# Patient Record
Sex: Female | Born: 1960 | Race: White | Hispanic: No | Marital: Married | Smoking: Never smoker
Health system: Southern US, Community
[De-identification: ages and names within clinical notes are randomized; demographics above are authoritative.]

## PROBLEM LIST (undated history)

## (undated) DIAGNOSIS — M1990 Unspecified osteoarthritis, unspecified site: Secondary | ICD-10-CM

## (undated) DIAGNOSIS — R519 Headache, unspecified: Secondary | ICD-10-CM

## (undated) DIAGNOSIS — R51 Headache: Secondary | ICD-10-CM

## (undated) DIAGNOSIS — G540 Brachial plexus disorders: Secondary | ICD-10-CM

## (undated) HISTORY — PX: DILATION AND CURETTAGE OF UTERUS: SHX78

## (undated) HISTORY — PX: OTHER SURGICAL HISTORY: SHX169

---

## 1999-03-24 ENCOUNTER — Other Ambulatory Visit: Admission: RE | Admit: 1999-03-24 | Discharge: 1999-03-24 | Payer: Self-pay | Admitting: *Deleted

## 2000-03-26 ENCOUNTER — Other Ambulatory Visit: Admission: RE | Admit: 2000-03-26 | Discharge: 2000-03-26 | Payer: Self-pay | Admitting: *Deleted

## 2001-05-19 ENCOUNTER — Other Ambulatory Visit: Admission: RE | Admit: 2001-05-19 | Discharge: 2001-05-19 | Payer: Self-pay | Admitting: Obstetrics and Gynecology

## 2004-08-01 ENCOUNTER — Ambulatory Visit (HOSPITAL_COMMUNITY): Admission: RE | Admit: 2004-08-01 | Discharge: 2004-08-01 | Payer: Self-pay | Admitting: Obstetrics and Gynecology

## 2004-08-01 ENCOUNTER — Encounter (INDEPENDENT_AMBULATORY_CARE_PROVIDER_SITE_OTHER): Payer: Self-pay | Admitting: *Deleted

## 2006-05-18 ENCOUNTER — Emergency Department (HOSPITAL_COMMUNITY): Admission: EM | Admit: 2006-05-18 | Discharge: 2006-05-18 | Payer: Self-pay | Admitting: Emergency Medicine

## 2007-10-04 ENCOUNTER — Encounter: Admission: RE | Admit: 2007-10-04 | Discharge: 2007-10-04 | Payer: Self-pay | Admitting: Obstetrics and Gynecology

## 2007-10-18 ENCOUNTER — Encounter: Admission: RE | Admit: 2007-10-18 | Discharge: 2007-10-18 | Payer: Self-pay | Admitting: Obstetrics and Gynecology

## 2008-11-20 ENCOUNTER — Encounter: Admission: RE | Admit: 2008-11-20 | Discharge: 2008-11-20 | Payer: Self-pay | Admitting: Obstetrics and Gynecology

## 2010-05-11 ENCOUNTER — Encounter: Payer: Self-pay | Admitting: Obstetrics and Gynecology

## 2010-09-05 NOTE — Op Note (Signed)
Allison Stevenson, Allison Stevenson              ACCOUNT NO.:  [id number]   MEDICAL RECORD NO.:  [id number]          PATIENT TYPE:  AMB   LOCATION:  SDC                           FACILITY:  [facility]   PHYSICIAN:  Richardean Sale, M.D.   DATE OF BIRTH:  1961/04/05   DATE OF PROCEDURE:  08/01/2004  DATE OF DISCHARGE:                                 OPERATIVE REPORT   PREOPERATIVE DIAGNOSIS:  Missed abortion.   POSTOPERATIVE DIAGNOSIS:  Missed abortion.   PROCEDURE:  Dilation and evacuation of uterus.   SURGEON:  Richardean Sale, M.D.   ANESTHESIA:  Conscious sedation, with paracervical block.   ESTIMATED BLOOD LOSS:  Minimal.   FINDINGS:  Products of conception.   SPECIMENS:  Products of conception.   COMPLICATIONS:  None.   ESTIMATED BLOOD LOSS:  Minimal.   INDICATIONS:  This is a 50 year old gravida 3 para 2-0-1-2 white female who  was originally seen at approximately [redacted] weeks gestation with vaginal  bleeding.  She underwent an ultrasound one week ago that showed an  intrauterine pregnancy at [redacted] weeks gestation with a heart rate of less than  120.  The patient returned to the office one week later, had a follow-up  ultrasound, which showed minimal growth of the fetal pole, and there was no  cardiac activity identified, consistent with missed AB.  She presents today  for surgical management with D&E.  Prior to the procedure, the risks had  been reviewed with the patient in detail.  We discussed the risks of  hemorrhage, infection, injury to the uterus that could require additional  surgery to the abdomen or cause infertility in the future, possibility of  anesthetic-related complications.  The patient expressed understanding of  all the risks and desires to proceed.  Informed consent obtained before  proceeding to the O.R.   PROCEDURE:  The patient was taken to the operating room, where she was given  conscious sedation.  She was then placed in the dorsal lithotomy position  and was prepped  and draped in the usual sterile fashion with Betadine.  A  red rubber catheter was then used to drain the bladder.  A bimanual exam was  performed which revealed a uterus that was approximately 6 weeks size,  midline, mobile, no masses.  Adnexa without masses.  At this point a  speculum was placed in the vagina, and 2 mL of 1% Nesacaine were injected at  the 12 o'clock position.  The cervix was then grasped with a single-tooth  tenaculum at the 12 o'clock position, and a paracervical block was  administered then using a total of 20 cc of 1% Nesacaine.  The cervix was  then very gently dilated with the [facility] dilators to a #23.  The #7 suction  curette was then introduced, and suction was performed.  Products of  conception were removed.  This was followed by a sharp curettage, where  there a moderate amount of products of conception that remained in the  uterus on the posterior wall.  Therefore, the suction was reintroduced, and  the additional products of conception were removed, and  sharp curettage was  performed one last time until a gritty texture was noted in all four  quadrants.  At this point, the procedure was terminated.  Specimen was sent  to pathology.  The single-tooth tenaculum was removed from the cervix.  There was minimal bleeding coming from the cervical os, and no bleeding from  the tenaculum site.  Speculum was removed.  Bimanual exam was performed.  The uterus was small, midline, mobile, with no masses.  At this point, the  patient was taken out of the dorsal lithotomy position.  Her conscious  sedation was reversed, and she was taken to the recovery room, awake, in  stable condition.  There were no complications.  All sponge, lap, needle,  and instrument counts were correct x 2.      JW/MEDQ  D:  08/01/2004  T:  08/01/2004  Job:  [id number]

## 2010-10-07 ENCOUNTER — Other Ambulatory Visit: Payer: Self-pay | Admitting: Family Medicine

## 2010-10-07 DIAGNOSIS — Z1231 Encounter for screening mammogram for malignant neoplasm of breast: Secondary | ICD-10-CM

## 2010-10-21 ENCOUNTER — Ambulatory Visit
Admission: RE | Admit: 2010-10-21 | Discharge: 2010-10-21 | Disposition: A | Discharge status: Home / Self Care | Payer: BC Managed Care – PPO | Source: Ambulatory Visit | Attending: Family Medicine | Admitting: Family Medicine

## 2010-10-21 DIAGNOSIS — Z1231 Encounter for screening mammogram for malignant neoplasm of breast: Secondary | ICD-10-CM

## 2014-04-10 ENCOUNTER — Other Ambulatory Visit: Payer: Self-pay | Admitting: Physician Assistant

## 2014-04-10 ENCOUNTER — Other Ambulatory Visit (HOSPITAL_COMMUNITY)
Admission: RE | Admit: 2014-04-10 | Discharge: 2014-04-10 | Disposition: A | Discharge status: Home / Self Care | Payer: BC Managed Care – PPO | Source: Ambulatory Visit | Attending: Family Medicine | Admitting: Family Medicine

## 2014-04-10 DIAGNOSIS — Z124 Encounter for screening for malignant neoplasm of cervix: Secondary | ICD-10-CM | POA: Insufficient documentation

## 2014-04-11 LAB — CYTOLOGY - PAP

## 2015-11-08 DIAGNOSIS — Z538 Procedure and treatment not carried out for other reasons: Secondary | ICD-10-CM | POA: Diagnosis not present

## 2015-11-08 DIAGNOSIS — Z1211 Encounter for screening for malignant neoplasm of colon: Secondary | ICD-10-CM | POA: Diagnosis not present

## 2017-03-15 DIAGNOSIS — R3915 Urgency of urination: Secondary | ICD-10-CM | POA: Diagnosis not present

## 2017-03-15 DIAGNOSIS — N3000 Acute cystitis without hematuria: Secondary | ICD-10-CM | POA: Diagnosis not present

## 2017-06-04 DIAGNOSIS — R6889 Other general symptoms and signs: Secondary | ICD-10-CM | POA: Diagnosis not present

## 2017-06-04 DIAGNOSIS — R350 Frequency of micturition: Secondary | ICD-10-CM | POA: Diagnosis not present

## 2017-06-04 DIAGNOSIS — N3000 Acute cystitis without hematuria: Secondary | ICD-10-CM | POA: Diagnosis not present

## 2017-06-06 DIAGNOSIS — G8929 Other chronic pain: Secondary | ICD-10-CM | POA: Diagnosis not present

## 2017-06-06 DIAGNOSIS — Z792 Long term (current) use of antibiotics: Secondary | ICD-10-CM | POA: Diagnosis not present

## 2017-06-06 DIAGNOSIS — M5441 Lumbago with sciatica, right side: Secondary | ICD-10-CM | POA: Diagnosis not present

## 2017-06-25 ENCOUNTER — Other Ambulatory Visit (HOSPITAL_COMMUNITY)
Admission: RE | Admit: 2017-06-25 | Discharge: 2017-06-25 | Disposition: A | Discharge status: Home / Self Care | Payer: BLUE CROSS/BLUE SHIELD | Source: Ambulatory Visit | Attending: Physician Assistant | Admitting: Physician Assistant

## 2017-06-25 ENCOUNTER — Other Ambulatory Visit: Payer: Self-pay | Admitting: Physician Assistant

## 2017-06-25 DIAGNOSIS — Z124 Encounter for screening for malignant neoplasm of cervix: Secondary | ICD-10-CM | POA: Diagnosis not present

## 2017-06-25 DIAGNOSIS — Z0000 Encounter for general adult medical examination without abnormal findings: Secondary | ICD-10-CM | POA: Diagnosis not present

## 2017-06-25 DIAGNOSIS — N952 Postmenopausal atrophic vaginitis: Secondary | ICD-10-CM | POA: Diagnosis not present

## 2017-06-25 DIAGNOSIS — G5601 Carpal tunnel syndrome, right upper limb: Secondary | ICD-10-CM | POA: Insufficient documentation

## 2017-06-25 DIAGNOSIS — R5383 Other fatigue: Secondary | ICD-10-CM | POA: Diagnosis not present

## 2017-06-25 DIAGNOSIS — M654 Radial styloid tenosynovitis [de Quervain]: Secondary | ICD-10-CM | POA: Insufficient documentation

## 2017-06-25 DIAGNOSIS — Z13220 Encounter for screening for lipoid disorders: Secondary | ICD-10-CM | POA: Diagnosis not present

## 2017-06-25 DIAGNOSIS — Z23 Encounter for immunization: Secondary | ICD-10-CM | POA: Diagnosis not present

## 2017-07-05 LAB — CYTOLOGY - PAP

## 2017-09-06 DIAGNOSIS — M51369 Other intervertebral disc degeneration, lumbar region without mention of lumbar back pain or lower extremity pain: Secondary | ICD-10-CM | POA: Insufficient documentation

## 2017-11-01 ENCOUNTER — Telehealth: Payer: Self-pay | Admitting: Vascular Surgery

## 2017-11-01 ENCOUNTER — Other Ambulatory Visit: Payer: Self-pay | Admitting: *Deleted

## 2017-11-01 NOTE — Telephone Encounter (Signed)
sch appt spk to pt 11/16/17 3pm f/u MD

## 2017-11-16 ENCOUNTER — Ambulatory Visit: Payer: BLUE CROSS/BLUE SHIELD | Admitting: Vascular Surgery

## 2017-11-22 ENCOUNTER — Other Ambulatory Visit: Payer: Self-pay

## 2017-11-22 ENCOUNTER — Encounter (HOSPITAL_COMMUNITY): Payer: Self-pay | Admitting: Urology

## 2017-11-22 ENCOUNTER — Encounter (HOSPITAL_COMMUNITY)
Admission: RE | Admit: 2017-11-22 | Discharge: 2017-11-22 | Disposition: A | Discharge status: Home / Self Care | Payer: Worker's Compensation | Source: Ambulatory Visit | Attending: Orthopedic Surgery | Admitting: Orthopedic Surgery

## 2017-11-22 DIAGNOSIS — M5136 Other intervertebral disc degeneration, lumbar region: Secondary | ICD-10-CM | POA: Diagnosis not present

## 2017-11-22 DIAGNOSIS — Z01818 Encounter for other preprocedural examination: Secondary | ICD-10-CM | POA: Diagnosis not present

## 2017-11-22 DIAGNOSIS — M4316 Spondylolisthesis, lumbar region: Secondary | ICD-10-CM | POA: Insufficient documentation

## 2017-11-22 DIAGNOSIS — M4186 Other forms of scoliosis, lumbar region: Secondary | ICD-10-CM | POA: Insufficient documentation

## 2017-11-22 HISTORY — DX: Headache, unspecified: R51.9

## 2017-11-22 HISTORY — DX: Brachial plexus disorders: G54.0

## 2017-11-22 HISTORY — DX: Headache: R51

## 2017-11-22 HISTORY — DX: Unspecified osteoarthritis, unspecified site: M19.90

## 2017-11-22 LAB — SURGICAL PCR SCREEN
MRSA, PCR: NEGATIVE
STAPHYLOCOCCUS AUREUS: POSITIVE — AB

## 2017-11-22 LAB — CBC
HEMATOCRIT: 38.3 % (ref 36.0–46.0)
HEMOGLOBIN: 12.5 g/dL (ref 12.0–15.0)
MCH: 30.9 pg (ref 26.0–34.0)
MCHC: 32.6 g/dL (ref 30.0–36.0)
MCV: 94.8 fL (ref 78.0–100.0)
Platelets: 215 10*3/uL (ref 150–400)
RBC: 4.04 MIL/uL (ref 3.87–5.11)
RDW: 12.2 % (ref 11.5–15.5)
WBC: 4.7 10*3/uL (ref 4.0–10.5)

## 2017-11-22 LAB — BASIC METABOLIC PANEL
ANION GAP: 8 (ref 5–15)
BUN: 12 mg/dL (ref 6–20)
CO2: 27 mmol/L (ref 22–32)
Calcium: 9.6 mg/dL (ref 8.9–10.3)
Chloride: 105 mmol/L (ref 98–111)
Creatinine, Ser: 0.64 mg/dL (ref 0.44–1.00)
Glucose, Bld: 94 mg/dL (ref 70–99)
POTASSIUM: 3.8 mmol/L (ref 3.5–5.1)
SODIUM: 140 mmol/L (ref 135–145)

## 2017-11-22 LAB — ABO/RH: ABO/RH(D): A POS

## 2017-11-22 LAB — TYPE AND SCREEN
ABO/RH(D): A POS
Antibody Screen: NEGATIVE

## 2017-11-22 NOTE — Pre-Procedure Instructions (Signed)
Allison Stevenson  11/22/2017      [facility] - [address] ([address]), [place] - [address] [place] (SE) [place] [address] Phone: [phone] Fax: [phone]    Your procedure is scheduled on Wednesday, August 14.             Report to [facility] Admitting at 6:30 AM                 Your surgery or procedure is scheduled for 8:30 A.M.   Call this number if you have problems the morning of surgery: [phone]  This is the number for the Pre- Surgical Desk.     For any other questions, please call [phone], Monday - Friday 8 AM - 4 PM.   Remember:  Do not eat or drink after midnight Tuesday, August 13.    Take these medicines the morning of surgery with A SIP OF WATER :   Take if needed: acetaminophen (TYLENOL), loratadine (CLARITIN).  1 Week prior to surgery STOP taking Aspirin, Aspirin Products (Goody Powder, Excedrin Migraine), Ibuprofen (Advil), Naproxen (Aleve), Vitamins and Herbal Products (ie Fish Oil).   Do not wear jewelry, make-up or nail polish.  Do not wear lotions, powders, or perfumes, or deodorant.  Do not shave 48 hours prior to surgery.  Men may shave face and neck.  Do not bring valuables to the hospital.  [facility] is not responsible for any belongings or valuables.  Contacts, dentures or bridgework may not be worn into surgery.  Leave your suitcase in the car.  After surgery it may be brought to your room.  For patients admitted to the hospital, discharge time will be determined by your treatment team.  Patients discharged the day of surgery will not be allowed to drive home.   Special instructions:   - Preparing For Surgery  Before surgery, you can play an important role. Because skin is not sterile, your skin needs to be as free of germs as possible. You can reduce the number of germs on your skin by washing with CHG (chlorahexidine gluconate) Soap before surgery.  CHG is an antiseptic cleaner  which kills germs and bonds with the skin to continue killing germs even after washing.    Oral Hygiene is also important to reduce your risk of infection.  Remember - BRUSH YOUR TEETH THE MORNING OF SURGERY WITH YOUR REGULAR TOOTHPASTE  Please do not use if you have an allergy to CHG or antibacterial soaps. If your skin becomes reddened/irritated stop using the CHG.  Do not shave (including legs and underarms) for at least 48 hours prior to first CHG shower. It is OK to shave your face.  Please follow these instructions carefully.   1. Shower the NIGHT BEFORE SURGERY and the MORNING OF SURGERY with CHG.   2. If you chose to wash your hair, wash your hair first as usual with your normal shampoo.  3. After you shampoo, rinse your hair and body thoroughly to remove the shampoo.  4. Use CHG as you would any other liquid soap. You can apply CHG directly to the skin and wash gently with a scrungie or a clean washcloth.   5. Apply the CHG Soap to your body ONLY FROM THE NECK DOWN.  Do not use on open wounds or open sores. Avoid contact with your eyes, ears, mouth and genitals (private parts). Wash Face and genitals (private parts)  with your normal soap.  6. Wash thoroughly, paying special attention to the area where your surgery will be performed.  7. Thoroughly rinse your body with warm water from the neck down.  8. DO NOT shower/wash with your normal soap after using and rinsing off the CHG Soap.  9. Pat yourself dry with a CLEAN TOWEL.  10. Wear CLEAN PAJAMAS to bed the night before surgery, wear comfortable clothes the morning of surgery  11. Place CLEAN SHEETS on your bed the night of your first shower and DO NOT SLEEP WITH PETS.   Day of Surgery:  Do not apply any deodorants/lotions.  Please wear clean clothes to the hospital/surgery center.   Remember to brush your teeth WITH YOUR REGULAR TOOTHPASTE.  Please read over the following fact sheets that you were given.
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
## 2017-11-22 NOTE — Progress Notes (Signed)
Nasal PCR negative for MRSA, positive for MSSA. Prescription called to pharmacy and patient notified.  

## 2017-11-22 NOTE — Progress Notes (Signed)
Pt sees Dr. Dierdre SearlesLi for Thoracic Outlet syndrome- pt unable to lift arms over head for long periods of time d/t numbness and tingling. Pt states this numbness will eventually go away, but she is worried about positioning during surgery. Pt states she will notify Dr. Arbie CookeyEarly of this as well as Dr. Shon BatonBrooks.   Denies cardiac history   Anesthesia review: order per Dr. Shon BatonBrooks for anesthesia consult, Fayrene FearingJames with anesthesia saw pt at her PAT appointment today  Patient denies shortness of breath, fever, cough and chest pain at PAT appointment   Patient verbalized understanding of instructions that were given to them at the PAT appointment. Patient was also instructed that they will need to review over the PAT instructions again at home before surgery.

## 2017-11-22 NOTE — Pre-Procedure Instructions (Signed)
Allison Stevenson  11/22/2017      [facility] [address] - [address] [place]) [place] [address] Phone: [phone] Fax: [phone]    Your procedure is scheduled on Wednesday, August 14.             [facility] at 6:30 AM                 Your surgery or procedure is scheduled for 8:30 A.M.   Call this number if you have problems the morning of surgery: [phone]  This is the number for the Pre- Surgical Desk.     For any other questions, please call [phone], Monday - Friday 8 AM - 4 PM.   Remember:  Do not eat or drink after midnight Tuesday, August 13.    Take these medicines the morning of surgery with A SIP OF WATER :  Take if needed: acetaminophen (TYLENOL), loratadine (CLARITIN). ,1 Week prior to surgery STOP taking Aspirin, Aspirin Products (Goody Powder, Excedrin Migraine), Ibuprofen (Advil), Naproxen (Aleve), Vitamins and Herbal Products (ie Fish Oil).   Do not wear jewelry, make-up or nail polish.  Do not wear lotions, powders, or perfumes, or deodorant.  Do not shave 48 hours prior to surgery.  Men may shave face and neck.  Do not bring valuables to the hospital.  [facility] is not responsible for any belongings or valuables.  Contacts, dentures or bridgework may not be worn into surgery.  Leave your suitcase in the car.  After surgery it may be brought to your room.  For patients admitted to the hospital, discharge time will be determined by your treatment team.  Patients discharged the day of surgery will not be allowed to drive home.   Special instructions:   [place]- Preparing For Surgery  Before surgery, you can play an important role. Because skin is not sterile, your skin needs to be as free of germs as possible. You can reduce the number of germs on your skin by washing with CHG (chlorahexidine gluconate) Soap before surgery.  CHG is an antiseptic cleaner which  kills germs and bonds with the skin to continue killing germs even after washing.    Oral Hygiene is also important to reduce your risk of infection.  Remember - BRUSH YOUR TEETH THE MORNING OF SURGERY WITH YOUR REGULAR TOOTHPASTE  Please do not use if you have an allergy to CHG or antibacterial soaps. If your skin becomes reddened/irritated stop using the CHG.  Do not shave (including legs and underarms) for at least 48 hours prior to first CHG shower. It is OK to shave your face.  Please follow these instructions carefully.   1. Shower the NIGHT BEFORE SURGERY and the MORNING OF SURGERY with CHG.   2. If you chose to wash your hair, wash your hair first as usual with your normal shampoo.  3. After you shampoo, rinse your hair and body thoroughly to remove the shampoo.  4. Use CHG as you would any other liquid soap. You can apply CHG directly to the skin and wash gently with a scrungie or a clean washcloth.   5. Apply the CHG Soap to your body ONLY FROM THE NECK DOWN.  Do not use on open wounds or open sores. Avoid contact with your eyes, ears, mouth and genitals (private parts). Wash Face and genitals (private parts)  with your normal soap.  6.  Wash thoroughly, paying special attention to the area where your surgery will be performed.  7. Thoroughly rinse your body with warm water from the neck down.  8. DO NOT shower/wash with your normal soap after using and rinsing off the CHG Soap.  9. Pat yourself dry with a CLEAN TOWEL.  10. Wear CLEAN PAJAMAS to bed the night before surgery, wear comfortable clothes the morning of surgery  11. Place CLEAN SHEETS on your bed the night of your first shower and DO NOT SLEEP WITH PETS.   Day of Surgery:  Do not apply any deodorants/lotions.  Please wear clean clothes to the hospital/surgery center.   Remember to brush your teeth WITH YOUR REGULAR TOOTHPASTE.  Please read over the following fact sheets that you were given.

## 2017-11-23 ENCOUNTER — Ambulatory Visit: Payer: BLUE CROSS/BLUE SHIELD | Admitting: Vascular Surgery

## 2017-11-24 ENCOUNTER — Encounter: Payer: Self-pay | Admitting: Vascular Surgery

## 2017-11-24 ENCOUNTER — Ambulatory Visit (INDEPENDENT_AMBULATORY_CARE_PROVIDER_SITE_OTHER): Payer: Worker's Compensation | Admitting: Vascular Surgery

## 2017-11-24 ENCOUNTER — Other Ambulatory Visit: Payer: Self-pay

## 2017-11-24 VITALS — BP 99/69 | HR 75 | Resp 18 | Ht 70.5 in | Wt 139.6 lb

## 2017-11-24 DIAGNOSIS — M5136 Other intervertebral disc degeneration, lumbar region: Secondary | ICD-10-CM
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
## 2017-11-26 NOTE — H&P (Addendum)
Patient ID: Salvadore DomMelody Stotts MRN: [id number] DOB/AGE: November 22, 1960 57 y.o.  Admit date: (Not on file)  Admission Diagnoses:  Degenerative lumbar Scoliosis  HPI: Pleasant 57 year old female patient presents to clinic for preop.  Patient is scheduled to have an anterior lumbar interbody fusion and extreme lumbar interbody fusion Wednesday, August 14.  That will be followed on Thursday, August 15 with a posterior  Interbody fusion L2-5. Pt has a hx of thoracic outlet syndrome which gives her b/l arm numbness and pain.  She has had episodes of arm paralysis.  Pt overall reports hx of good health. Past Medical History: Past Medical History:  Diagnosis Date  . Arthritis    thumbs  . Headache    history of migraines when on birth control and artificial sweeteners  . Thoracic outlet syndrome     Surgical History: Past Surgical History:  Procedure Laterality Date  . CESAREAN SECTION    . DILATION AND CURETTAGE OF UTERUS     after miscarriage  . vein stripped      Family History: No family history on file.  Social History: Social History   Socioeconomic History  . Marital status: Married    Spouse name: Not on file  . Number of children: Not on file  . Years of education: Not on file  . Highest education level: Not on file  Occupational History  . Not on file  Social Needs  . Financial resource strain: Not on file  . Food insecurity:    Worry: Not on file    Inability: Not on file  . Transportation needs:    Medical: Not on file    Non-medical: Not on file  Tobacco Use  . Smoking status: Never Smoker  . Smokeless tobacco: Never Used  Substance and Sexual Activity  . Alcohol use: Never    Frequency: Never  . Drug use: Never  . Sexual activity: Not on file  Lifestyle  . Physical activity:    Days per week: Not on file    Minutes per session: Not on file  . Stress: Not on file  Relationships  . Social connections:    Talks on phone: Not on file    Gets  together: Not on file    Attends religious service: Not on file    Active member of club or organization: Not on file    Attends meetings of clubs or organizations: Not on file    Relationship status: Not on file  . Intimate partner violence:    Fear of current or ex partner: Not on file    Emotionally abused: Not on file    Physically abused: Not on file    Forced sexual activity: Not on file  Other Topics Concern  . Not on file  Social History Narrative  . Not on file    Allergies: Bee venom  Medications: I have reviewed the patient's current medications.  Vital Signs: No data found.  Radiology: No results found.  Labs: No results for input(s): WBC, RBC, HCT, PLT in the last 72 hours. No results for input(s): NA, K, CL, CO2, BUN, CREATININE, GLUCOSE, CALCIUM in the last 72 hours. No results for input(s): LABPT, INR in the last 72 hours.  Review of Systems: ROS  Physical Exam: There is no height or weight on file to calculate BMI.  Physical Exam  Constitutional: She is oriented to person, place, and time. She appears well-developed and well-nourished.  HENT:  Head: Normocephalic.  Cardiovascular: Normal  rate and regular rhythm.  Respiratory: Effort normal and breath sounds normal.  GI: Soft. Bowel sounds are normal.  Neurological: She is alert and oriented to person, place, and time.  Skin: Skin is warm and dry.  Psychiatric: She has a normal mood and affect. Her behavior is normal. Judgment and thought content normal.    Continues to have severe debilitating low back pain radiating into the buttocks.  She notes intermittent dysesthesias in the right lower extremity.  No significant hip, knee, ankle pain with isolated joint range of motion. Neuro: 5 out of 5 strength in the lower extremity, negative straight leg raise test.  Negative femoral stretch test.  Positive decreased sensation light touch in the L2 and L3 dermatome right side.  Symmetrical 2+ deep tendon  reflexes at the knee and the Achilles. Reflexes: Babinski: Negative Hoffman: Negative Vascular: Lower extremity peripheral pulses are 2+ and symmetrical in the lower extremity.  Compartments are soft and nontender  Imaging studies: 2 view scoliosis x-rays demonstrate a slight upper thoracic scoliotic curve.  overall sagittal balance is well maintained.  Coronal balance she is slightly shifted approximately 3 cm due to the lateral listhesis.  Assessment and Plan: Risks and benefits of surgery were discussed with the patient. These include: Infection, bleeding, death, stroke, paralysis, ongoing or worse pain, need for additional surgery, nonunion, leak of spinal fluid, adjacent segment degeneration requiring additional fusion surgery, need for posterior decompression and/or fusion. Bleeding from major vessels, and blood clots (deep venous thrombosis)requiring additional treatment. Due to the abdominal contents requiring further intervention, loss in bowel and bladder control. Additional risk for female patients: Retrograde ejaculation, and therefore infertility.  Goal of surgery: Reduced (not eliminated) pain and therefore improved quality of life.  Anette Riedel, PAC for Venita Lick, MD [facility] [phone]  Patient presents today for two-stage surgical fusion of her lumbar spine.  Clinical exam is unchanged from her last office visit on 11/26/2017.  I reviewed the procedure with the patient and her husband.  Today's procedure will be an anterior lumbar interbody fusion at L4-5, followed by a lateral interbody fusion L2-3 and L3-4.  Tomorrow we will plan on posterior supplemental pedicle screw fixation L2-5.  This should allow for correction of the degenerative scoliosis and address the sagittal imbalance degenerative disc disease.  I have again gone over the risks of surgery which include infection, bleeding, death, stroke, paralysis, ongoing or worse pain, need for additional surgery,  nonunion, leak of spinal fluid, adjacent segment degeneration requiring additional surgery including extension of the fusion, injury or damage to the lumbar plexus which are resulting in hip flexor weakness and difficulty walking injury to the abdominal contents requiring further intervention, loss of bowel and bladder control, deep venous thrombosis (blood clots), bleeding from major vessels requiring transfusion or additional intervention.  The patient has expressed understanding of the risks as well as the desire to move forward with surgery.

## 2017-12-01 ENCOUNTER — Inpatient Hospital Stay (HOSPITAL_COMMUNITY)
Admission: RE | Admit: 2017-12-01 | Discharge: 2017-12-04 | DRG: 455 | Disposition: A | Discharge status: Home Health | Payer: Worker's Compensation | Source: Ambulatory Visit | Attending: Orthopedic Surgery | Admitting: Orthopedic Surgery

## 2017-12-01 ENCOUNTER — Inpatient Hospital Stay (HOSPITAL_COMMUNITY): Payer: Worker's Compensation

## 2017-12-01 ENCOUNTER — Encounter (HOSPITAL_COMMUNITY): Payer: Self-pay | Admitting: Certified Registered"

## 2017-12-01 ENCOUNTER — Other Ambulatory Visit: Payer: Self-pay

## 2017-12-01 ENCOUNTER — Inpatient Hospital Stay (HOSPITAL_COMMUNITY): Payer: Worker's Compensation | Admitting: Certified Registered"

## 2017-12-01 ENCOUNTER — Inpatient Hospital Stay (HOSPITAL_COMMUNITY): Payer: Worker's Compensation | Admitting: Physician Assistant

## 2017-12-01 ENCOUNTER — Inpatient Hospital Stay (HOSPITAL_COMMUNITY)
Admission: RE | Disposition: A | Discharge status: Home Health | Payer: Self-pay | Source: Ambulatory Visit | Attending: Orthopedic Surgery

## 2017-12-01 DIAGNOSIS — M19041 Primary osteoarthritis, right hand: Secondary | ICD-10-CM | POA: Diagnosis present

## 2017-12-01 DIAGNOSIS — M5417 Radiculopathy, lumbosacral region: Secondary | ICD-10-CM | POA: Diagnosis not present

## 2017-12-01 DIAGNOSIS — M19042 Primary osteoarthritis, left hand: Secondary | ICD-10-CM | POA: Diagnosis present

## 2017-12-01 DIAGNOSIS — M4156 Other secondary scoliosis, lumbar region: Secondary | ICD-10-CM | POA: Diagnosis present

## 2017-12-01 DIAGNOSIS — Z419 Encounter for procedure for purposes other than remedying health state, unspecified: Secondary | ICD-10-CM

## 2017-12-01 DIAGNOSIS — Z981 Arthrodesis status: Secondary | ICD-10-CM

## 2017-12-01 DIAGNOSIS — M5136 Other intervertebral disc degeneration, lumbar region: Secondary | ICD-10-CM | POA: Diagnosis not present

## 2017-12-01 DIAGNOSIS — Q763 Congenital scoliosis due to congenital bony malformation: Secondary | ICD-10-CM | POA: Diagnosis not present

## 2017-12-01 DIAGNOSIS — Z91030 Bee allergy status: Secondary | ICD-10-CM

## 2017-12-01 DIAGNOSIS — M4150 Other secondary scoliosis, site unspecified: Secondary | ICD-10-CM | POA: Diagnosis present

## 2017-12-01 HISTORY — PX: ANTERIOR LAT LUMBAR FUSION: SHX1168

## 2017-12-01 HISTORY — PX: ANTERIOR LUMBAR FUSION: SHX1170

## 2017-12-01 HISTORY — PX: ABDOMINAL EXPOSURE: SHX5708

## 2017-12-01 SURGERY — ANTERIOR LUMBAR FUSION 1 LEVEL
Anesthesia: General | Site: Back

## 2017-12-01 MED ORDER — ONDANSETRON HCL 4 MG/2ML IJ SOLN
INTRAMUSCULAR | Status: DC | PRN
  Administered 2017-12-01: 4 mg via INTRAVENOUS

## 2017-12-01 MED ORDER — 0.9 % SODIUM CHLORIDE (POUR BTL) OPTIME
TOPICAL | Status: DC | PRN
  Administered 2017-12-01 (×3): 1000 mL

## 2017-12-01 MED ORDER — DEXAMETHASONE SODIUM PHOSPHATE 10 MG/ML IJ SOLN
INTRAMUSCULAR | Status: DC | PRN
  Administered 2017-12-01: 10 mg via INTRAVENOUS

## 2017-12-01 MED ORDER — PROMETHAZINE HCL 25 MG/ML IJ SOLN: 6.2500 mg | INTRAMUSCULAR | Status: DC | PRN

## 2017-12-01 MED ORDER — PHENYLEPHRINE 40 MCG/ML (10ML) SYRINGE FOR IV PUSH (FOR BLOOD PRESSURE SUPPORT)
PREFILLED_SYRINGE | INTRAVENOUS | Status: DC | PRN
  Administered 2017-12-01: 80 ug via INTRAVENOUS
  Administered 2017-12-01: 40 ug via INTRAVENOUS
  Administered 2017-12-01 (×3): 80 ug via INTRAVENOUS

## 2017-12-01 MED ORDER — LIDOCAINE 2% (20 MG/ML) 5 ML SYRINGE
INTRAMUSCULAR | Status: DC | PRN
  Administered 2017-12-01: 80 mg via INTRAVENOUS

## 2017-12-01 MED ORDER — SODIUM CHLORIDE 0.9 % IV SOLN
INTRAVENOUS | Status: AC
  Filled 2017-12-01: qty 1.2

## 2017-12-01 MED ORDER — LIDOCAINE 2% (20 MG/ML) 5 ML SYRINGE
INTRAMUSCULAR | Status: AC
  Filled 2017-12-01: qty 5

## 2017-12-01 MED ORDER — HYDROMORPHONE HCL 1 MG/ML IJ SOLN
INTRAMUSCULAR | Status: AC
  Filled 2017-12-01: qty 1

## 2017-12-01 MED ORDER — OXYCODONE HCL 5 MG/5ML PO SOLN: 5.0000 mg | Freq: Once | ORAL | Status: DC | PRN

## 2017-12-01 MED ORDER — PROPOFOL 500 MG/50ML IV EMUL
INTRAVENOUS | Status: DC | PRN
  Administered 2017-12-01: 50 ug/kg/min via INTRAVENOUS

## 2017-12-01 MED ORDER — CEFAZOLIN SODIUM-DEXTROSE 2-4 GM/100ML-% IV SOLN
INTRAVENOUS | Status: AC
  Filled 2017-12-01: qty 100

## 2017-12-01 MED ORDER — MIDAZOLAM HCL 5 MG/5ML IJ SOLN
INTRAMUSCULAR | Status: DC | PRN
Start: 2017-12-01 — End: 2017-12-01
  Administered 2017-12-01: 1 mg via INTRAVENOUS
  Administered 2017-12-01: 2 mg via INTRAVENOUS
  Administered 2017-12-01: 1 mg via INTRAVENOUS

## 2017-12-01 MED ORDER — ROCURONIUM BROMIDE 10 MG/ML (PF) SYRINGE
PREFILLED_SYRINGE | INTRAVENOUS | Status: AC
  Filled 2017-12-01: qty 10

## 2017-12-01 MED ORDER — ACETAMINOPHEN 650 MG RE SUPP: 650.0000 mg | RECTAL | Status: DC | PRN

## 2017-12-01 MED ORDER — SUFENTANIL CITRATE 50 MCG/ML IV SOLN
INTRAVENOUS | Status: AC
Start: 2017-12-01 — End: ?
  Filled 2017-12-01: qty 1

## 2017-12-01 MED ORDER — METHOCARBAMOL 500 MG PO TABS
500.0000 mg | ORAL_TABLET | Freq: Four times a day (QID) | ORAL | Status: DC | PRN
  Administered 2017-12-01: 500 mg via ORAL
  Filled 2017-12-01: qty 1

## 2017-12-01 MED ORDER — SODIUM CHLORIDE 0.9 % IV SOLN
INTRAVENOUS | Status: DC | PRN
  Administered 2017-12-01: 50 ug/min via INTRAVENOUS

## 2017-12-01 MED ORDER — HYDROMORPHONE HCL 1 MG/ML IJ SOLN
0.2500 mg | INTRAMUSCULAR | Status: DC | PRN
  Administered 2017-12-01 (×3): 0.5 mg via INTRAVENOUS

## 2017-12-01 MED ORDER — PROPOFOL 10 MG/ML IV BOLUS
INTRAVENOUS | Status: DC | PRN
  Administered 2017-12-01: 10 mg via INTRAVENOUS
  Administered 2017-12-01: 20 mg via INTRAVENOUS
  Administered 2017-12-01: 10 mg via INTRAVENOUS
  Administered 2017-12-01 (×2): 20 mg via INTRAVENOUS
  Administered 2017-12-01: 130 mg via INTRAVENOUS

## 2017-12-01 MED ORDER — MIDAZOLAM HCL 2 MG/2ML IJ SOLN
INTRAMUSCULAR | Status: AC
  Filled 2017-12-01: qty 2

## 2017-12-01 MED ORDER — SUGAMMADEX SODIUM 200 MG/2ML IV SOLN
INTRAVENOUS | Status: DC | PRN
  Administered 2017-12-01: 130 mg via INTRAVENOUS

## 2017-12-01 MED ORDER — HEMOSTATIC AGENTS (NO CHARGE) OPTIME
TOPICAL | Status: DC | PRN
  Administered 2017-12-01 (×2): 1

## 2017-12-01 MED ORDER — OXYCODONE HCL 5 MG PO TABS: 5.0000 mg | ORAL_TABLET | Freq: Once | ORAL | Status: DC | PRN

## 2017-12-01 MED ORDER — ROCURONIUM BROMIDE 10 MG/ML (PF) SYRINGE
PREFILLED_SYRINGE | INTRAVENOUS | Status: DC | PRN
  Administered 2017-12-01: 10 mg via INTRAVENOUS
  Administered 2017-12-01: 60 mg via INTRAVENOUS
  Administered 2017-12-01: 20 mg via INTRAVENOUS
  Administered 2017-12-01: 10 mg via INTRAVENOUS

## 2017-12-01 MED ORDER — PHENYLEPHRINE 40 MCG/ML (10ML) SYRINGE FOR IV PUSH (FOR BLOOD PRESSURE SUPPORT)
PREFILLED_SYRINGE | INTRAVENOUS | Status: AC
  Filled 2017-12-01: qty 10

## 2017-12-01 MED ORDER — DOCUSATE SODIUM 100 MG PO CAPS
100.0000 mg | ORAL_CAPSULE | Freq: Two times a day (BID) | ORAL | Status: DC
  Administered 2017-12-01 – 2017-12-04 (×5): 100 mg via ORAL
  Filled 2017-12-01 (×6): qty 1

## 2017-12-01 MED ORDER — DEXAMETHASONE SODIUM PHOSPHATE 10 MG/ML IJ SOLN
INTRAMUSCULAR | Status: AC
  Filled 2017-12-01: qty 1

## 2017-12-01 MED ORDER — OXYCODONE HCL 5 MG PO TABS
10.0000 mg | ORAL_TABLET | ORAL | Status: DC | PRN
  Administered 2017-12-04 (×3): 10 mg via ORAL
  Filled 2017-12-01 (×3): qty 2

## 2017-12-01 MED ORDER — MAGNESIUM CITRATE PO SOLN: 1.0000 | Freq: Once | ORAL | Status: DC | PRN

## 2017-12-01 MED ORDER — BUPIVACAINE-EPINEPHRINE 0.25% -1:200000 IJ SOLN
INTRAMUSCULAR | Status: AC
  Filled 2017-12-01: qty 1

## 2017-12-01 MED ORDER — CEFAZOLIN SODIUM-DEXTROSE 2-4 GM/100ML-% IV SOLN
2.0000 g | Freq: Three times a day (TID) | INTRAVENOUS | Status: AC
  Administered 2017-12-01 – 2017-12-02 (×2): 2 g via INTRAVENOUS
  Filled 2017-12-01 (×2): qty 100

## 2017-12-01 MED ORDER — CALCIUM CHLORIDE 10 % IV SOLN
INTRAVENOUS | Status: AC
  Filled 2017-12-01: qty 10

## 2017-12-01 MED ORDER — CEFAZOLIN SODIUM-DEXTROSE 2-4 GM/100ML-% IV SOLN
2.0000 g | INTRAVENOUS | Status: AC
  Administered 2017-12-01 (×2): 2 g via INTRAVENOUS

## 2017-12-01 MED ORDER — MEPERIDINE HCL 50 MG/ML IJ SOLN: 6.2500 mg | INTRAMUSCULAR | Status: DC | PRN

## 2017-12-01 MED ORDER — OXYCODONE HCL 5 MG PO TABS
5.0000 mg | ORAL_TABLET | ORAL | Status: DC | PRN
  Administered 2017-12-01 – 2017-12-03 (×6): 5 mg via ORAL
  Filled 2017-12-01 (×6): qty 1

## 2017-12-01 MED ORDER — ONDANSETRON HCL 4 MG/2ML IJ SOLN
4.0000 mg | Freq: Four times a day (QID) | INTRAMUSCULAR | Status: DC | PRN
  Administered 2017-12-02: 4 mg via INTRAVENOUS
  Filled 2017-12-01: qty 2

## 2017-12-01 MED ORDER — ONDANSETRON HCL 4 MG/2ML IJ SOLN
INTRAMUSCULAR | Status: AC
  Filled 2017-12-01: qty 2

## 2017-12-01 MED ORDER — SODIUM CHLORIDE 0.9 % IV SOLN
250.0000 mL | INTRAVENOUS | Status: DC
  Administered 2017-12-02: 250 mL via INTRAVENOUS

## 2017-12-01 MED ORDER — SODIUM CHLORIDE 0.9% FLUSH: 3.0000 mL | INTRAVENOUS | Status: DC | PRN

## 2017-12-01 MED ORDER — MORPHINE SULFATE (PF) 2 MG/ML IV SOLN
1.0000 mg | INTRAVENOUS | Status: DC | PRN
  Administered 2017-12-02 – 2017-12-03 (×2): 1 mg via INTRAVENOUS
  Filled 2017-12-01 (×2): qty 1

## 2017-12-01 MED ORDER — SODIUM CHLORIDE 0.9% FLUSH
3.0000 mL | Freq: Two times a day (BID) | INTRAVENOUS | Status: DC
  Administered 2017-12-01 – 2017-12-04 (×3): 3 mL via INTRAVENOUS

## 2017-12-01 MED ORDER — ONDANSETRON HCL 4 MG PO TABS: 4.0000 mg | ORAL_TABLET | Freq: Four times a day (QID) | ORAL | Status: DC | PRN

## 2017-12-01 MED ORDER — CEFAZOLIN SODIUM-DEXTROSE 2-4 GM/100ML-% IV SOLN: 2.0000 g | INTRAVENOUS | Status: DC

## 2017-12-01 MED ORDER — PROPOFOL 10 MG/ML IV BOLUS
INTRAVENOUS | Status: AC
  Filled 2017-12-01: qty 20

## 2017-12-01 MED ORDER — POLYETHYLENE GLYCOL 3350 17 G PO PACK
17.0000 g | PACK | Freq: Every day | ORAL | Status: DC | PRN
  Administered 2017-12-04: 17 g via ORAL
  Filled 2017-12-01: qty 1

## 2017-12-01 MED ORDER — SODIUM CHLORIDE 0.9 % IJ SOLN
INTRAMUSCULAR | Status: AC
  Filled 2017-12-01: qty 20

## 2017-12-01 MED ORDER — SODIUM CHLORIDE 0.9 % IV SOLN
INTRAVENOUS | Status: DC | PRN
  Administered 2017-12-01: 500 mL

## 2017-12-01 MED ORDER — LACTATED RINGERS IV SOLN
INTRAVENOUS | Status: DC | PRN
  Administered 2017-12-01 (×2): via INTRAVENOUS

## 2017-12-01 MED ORDER — LACTATED RINGERS IV SOLN: INTRAVENOUS | Status: DC

## 2017-12-01 MED ORDER — ACETAMINOPHEN 325 MG PO TABS: 650.0000 mg | ORAL_TABLET | ORAL | Status: DC | PRN

## 2017-12-01 MED ORDER — GLYCOPYRROLATE PF 0.2 MG/ML IJ SOSY
PREFILLED_SYRINGE | INTRAMUSCULAR | Status: DC | PRN
  Administered 2017-12-01 (×2): .2 mg via INTRAVENOUS

## 2017-12-01 MED ORDER — MENTHOL 3 MG MT LOZG: 1.0000 | LOZENGE | OROMUCOSAL | Status: DC | PRN

## 2017-12-01 MED ORDER — SUFENTANIL CITRATE 50 MCG/ML IV SOLN
INTRAVENOUS | Status: DC | PRN
  Administered 2017-12-01: 15 ug via INTRAVENOUS
  Administered 2017-12-01 (×3): 5 ug via INTRAVENOUS
  Administered 2017-12-01: 10 ug via INTRAVENOUS

## 2017-12-01 MED ORDER — EPINEPHRINE PF 1 MG/10ML IJ SOSY
PREFILLED_SYRINGE | INTRAMUSCULAR | Status: AC
  Filled 2017-12-01: qty 10

## 2017-12-01 MED ORDER — METHOCARBAMOL 1000 MG/10ML IJ SOLN
500.0000 mg | Freq: Four times a day (QID) | INTRAVENOUS | Status: DC | PRN
  Administered 2017-12-02: 500 mg via INTRAVENOUS
  Filled 2017-12-01: qty 500
  Filled 2017-12-01: qty 5

## 2017-12-01 MED ORDER — PHENOL 1.4 % MT LIQD: 1.0000 | OROMUCOSAL | Status: DC | PRN

## 2017-12-01 SURGICAL SUPPLY — 111 items
ADH SKN CLS APL DERMABOND .7 (GAUZE/BANDAGES/DRESSINGS) ×1
ALIF IMPLANT 40X27X14-12 (Cage) ×2 IMPLANT
APPLIER CLIP 11 MED OPEN (CLIP) ×4
APR CLP MED 11 20 MLT OPN (CLIP) ×2
BLADE CLIPPER SURG (BLADE) ×1 IMPLANT
BLADE SURG 10 STRL SS (BLADE) ×4 IMPLANT
BONE MATRIX OSTEOCEL PRO MED (Bone Implant) ×1 IMPLANT
BONE VIVIGEN FORMABLE 10CC (Bone Implant) ×2 IMPLANT
BONE VIVIGEN FORMABLE 5.4CC (Bone Implant) ×4 IMPLANT
CABLE BIPOLOR RESECTION CORD (MISCELLANEOUS) ×2 IMPLANT
CAGE MODULUS XL 12X18X55 - 10 (Cage) ×2 IMPLANT
CLIP APPLIE 11 MED OPEN (CLIP) ×2 IMPLANT
CLIP LIGATING EXTRA MED SLVR (CLIP) ×1 IMPLANT
CLIP LIGATING EXTRA SM BLUE (MISCELLANEOUS) ×1 IMPLANT
CLSR STERI-STRIP ANTIMIC 1/2X4 (GAUZE/BANDAGES/DRESSINGS) ×3 IMPLANT
COVER SURGICAL LIGHT HANDLE (MISCELLANEOUS) ×4 IMPLANT
DERMABOND ADVANCED (GAUZE/BANDAGES/DRESSINGS) ×1
DERMABOND ADVANCED .7 DNX12 (GAUZE/BANDAGES/DRESSINGS) ×1 IMPLANT
DRAPE C-ARM 42X72 X-RAY (DRAPES) ×8 IMPLANT
DRAPE C-ARMOR (DRAPES) ×4 IMPLANT
DRAPE INCISE IOBAN 66X45 STRL (DRAPES) IMPLANT
DRAPE LAPAROTOMY T 102X78X121 (DRAPES) ×1 IMPLANT
DRAPE POUCH INSTRU U-SHP 10X18 (DRAPES) ×2 IMPLANT
DRAPE SURG 17X23 STRL (DRAPES) ×2 IMPLANT
DRAPE U-SHAPE 47X51 STRL (DRAPES) ×6 IMPLANT
DRSG OPSITE POSTOP 3X4 (GAUZE/BANDAGES/DRESSINGS) ×1 IMPLANT
DRSG OPSITE POSTOP 4X6 (GAUZE/BANDAGES/DRESSINGS) ×2 IMPLANT
DRSG OPSITE POSTOP 4X8 (GAUZE/BANDAGES/DRESSINGS) ×2 IMPLANT
DURAPREP 26ML APPLICATOR (WOUND CARE) ×4 IMPLANT
ELECT BLADE 4.0 EZ CLEAN MEGAD (MISCELLANEOUS) ×4
ELECT CAUTERY BLADE 6.4 (BLADE) ×2 IMPLANT
ELECT PENCIL ROCKER SW 15FT (MISCELLANEOUS) ×4 IMPLANT
ELECT REM PT RETURN 9FT ADLT (ELECTROSURGICAL) ×2
ELECTRODE BLDE 4.0 EZ CLN MEGD (MISCELLANEOUS) ×3 IMPLANT
ELECTRODE REM PT RTRN 9FT ADLT (ELECTROSURGICAL) ×2 IMPLANT
FLOSEAL 5ML (HEMOSTASIS) ×1 IMPLANT
GAUZE 4X4 16PLY RFD (DISPOSABLE) IMPLANT
GLOVE BIO SURGEON STRL SZ 6.5 (GLOVE) ×4 IMPLANT
GLOVE BIO SURGEON STRL SZ7.5 (GLOVE) IMPLANT
GLOVE BIOGEL PI IND STRL 6.5 (GLOVE) ×2 IMPLANT
GLOVE BIOGEL PI IND STRL 8.5 (GLOVE) ×3 IMPLANT
GLOVE BIOGEL PI INDICATOR 6.5 (GLOVE) ×2
GLOVE BIOGEL PI INDICATOR 8.5 (GLOVE) ×3
GLOVE ECLIPSE 8.0 STRL XLNG CF (GLOVE) ×3 IMPLANT
GLOVE SS BIOGEL STRL SZ 7.5 (GLOVE) ×1 IMPLANT
GLOVE SS BIOGEL STRL SZ 8.5 (GLOVE) ×3 IMPLANT
GLOVE SUPERSENSE BIOGEL SZ 7.5 (GLOVE) ×1
GLOVE SUPERSENSE BIOGEL SZ 8.5 (GLOVE) ×4
GLOVE SURG SS PI 7.0 STRL IVOR (GLOVE) ×1 IMPLANT
GOWN STRL REUS W/ TWL LRG LVL3 (GOWN DISPOSABLE) ×3 IMPLANT
GOWN STRL REUS W/ TWL XL LVL3 (GOWN DISPOSABLE) IMPLANT
GOWN STRL REUS W/TWL 2XL LVL3 (GOWN DISPOSABLE) ×7 IMPLANT
GOWN STRL REUS W/TWL LRG LVL3 (GOWN DISPOSABLE) ×6
GOWN STRL REUS W/TWL XL LVL3 (GOWN DISPOSABLE) ×4
GRAFT BNE MATRIX VG FRMBL L 10 (Bone Implant) IMPLANT
GRAFT BNE MATRIX VG FRMBL MD 5 (Bone Implant) IMPLANT
HEMOSTAT SNOW SURGICEL 2X4 (HEMOSTASIS) IMPLANT
IMPL ALIF 40X27X14-12 (Cage) IMPLANT
INSERT FOGARTY 61MM (MISCELLANEOUS) IMPLANT
INSERT FOGARTY SM (MISCELLANEOUS) IMPLANT
KIT BASIN OR (CUSTOM PROCEDURE TRAY) ×4 IMPLANT
KIT DILATOR XLIF 5 (KITS) IMPLANT
KIT SURGICAL ACCESS MAXCESS 4 (KITS) ×1 IMPLANT
KIT TURNOVER KIT B (KITS) ×4 IMPLANT
KIT XLIF (KITS) ×1
LOOP VESSEL MAXI BLUE (MISCELLANEOUS) ×1 IMPLANT
LOOP VESSEL MINI RED (MISCELLANEOUS) IMPLANT
MODULE EMG NDL SSEP NVM5 (NEEDLE) IMPLANT
MODULE EMG NEEDLE SSEP NVM5 (NEEDLE) ×2 IMPLANT
MODULE NVM5 NEXT GEN EMG (NEEDLE) ×1 IMPLANT
NDL SPNL 18GX3.5 QUINCKE PK (NEEDLE) ×2 IMPLANT
NEEDLE 22X1 1/2 (OR ONLY) (NEEDLE) ×2 IMPLANT
NEEDLE SPNL 18GX3.5 QUINCKE PK (NEEDLE) ×4 IMPLANT
NS IRRIG 1000ML POUR BTL (IV SOLUTION) ×8 IMPLANT
PACK LAMINECTOMY ORTHO (CUSTOM PROCEDURE TRAY) ×4 IMPLANT
PACK UNIVERSAL I (CUSTOM PROCEDURE TRAY) ×4 IMPLANT
PAD ARMBOARD 7.5X6 YLW CONV (MISCELLANEOUS) ×10 IMPLANT
SCREW BN 30X6.5XNS SPNE (Screw) IMPLANT
SCREW BONE 6.5X30 (Screw) ×2 IMPLANT
SPONGE INTESTINAL PEANUT (DISPOSABLE) ×6 IMPLANT
SPONGE LAP 18X18 X RAY DECT (DISPOSABLE) ×1 IMPLANT
SPONGE LAP 4X18 RFD (DISPOSABLE) ×2 IMPLANT
SPONGE SURGIFOAM ABS GEL 100 (HEMOSTASIS) ×1 IMPLANT
STAPLER VISISTAT 35W (STAPLE) ×2 IMPLANT
SUT BONE WAX W31G (SUTURE) ×2 IMPLANT
SUT MNCRL AB 3-0 PS2 18 (SUTURE) ×1 IMPLANT
SUT MON AB 3-0 SH 27 (SUTURE) ×4
SUT MON AB 3-0 SH27 (SUTURE) ×2 IMPLANT
SUT PDS AB 1 CTX 36 (SUTURE) ×2 IMPLANT
SUT PROLENE 4 0 RB 1 (SUTURE)
SUT PROLENE 4-0 RB1 .5 CRCL 36 (SUTURE) IMPLANT
SUT PROLENE 5 0 C 1 24 (SUTURE) IMPLANT
SUT PROLENE 5 0 CC1 (SUTURE) IMPLANT
SUT PROLENE 6 0 C 1 30 (SUTURE) ×2 IMPLANT
SUT PROLENE 6 0 CC (SUTURE) IMPLANT
SUT SILK 0 TIES 10X30 (SUTURE) ×2 IMPLANT
SUT SILK 2 0 TIES 10X30 (SUTURE) ×4 IMPLANT
SUT SILK 2 0SH CR/8 30 (SUTURE) IMPLANT
SUT SILK 3 0 TIES 10X30 (SUTURE) ×4 IMPLANT
SUT SILK 3 0SH CR/8 30 (SUTURE) IMPLANT
SUT VIC AB 1 CT1 18XCR BRD 8 (SUTURE) ×2 IMPLANT
SUT VIC AB 1 CT1 27 (SUTURE) ×4
SUT VIC AB 1 CT1 27XBRD ANBCTR (SUTURE) ×2 IMPLANT
SUT VIC AB 1 CT1 8-18 (SUTURE) ×4
SUT VIC AB 2-0 CT1 18 (SUTURE) ×5 IMPLANT
SYR BULB IRRIGATION 50ML (SYRINGE) ×4 IMPLANT
SYR CONTROL 10ML LL (SYRINGE) ×2 IMPLANT
TAPE CLOTH 4X10 WHT NS (GAUZE/BANDAGES/DRESSINGS) ×2 IMPLANT
TOWEL GREEN STERILE (TOWEL DISPOSABLE) ×5 IMPLANT
TOWEL GREEN STERILE FF (TOWEL DISPOSABLE) ×4 IMPLANT
TRAY FOLEY MTR SLVR 14FR STAT (SET/KITS/TRAYS/PACK) ×1 IMPLANT

## 2017-12-01 NOTE — Op Note (Signed)
    OPERATIVE REPORT  DATE OF SURGERY: 12/01/2017  PATIENT: Allison Stevenson, 57 y.o. female MRN: [id number]  DOB: 08/19/1960  PRE-OPERATIVE DIAGNOSIS: Degenerative disc disease  POST-OPERATIVE DIAGNOSIS:  Same  PROCEDURE: Anterior exposure for L4-5 fusion  SURGEON:  Gretta Beganodd Bashir Marchetti, M.D.  Co-surgeon for the exposure Dr. Venita Lickahari Brooks  PHYSICIAN ASSISTANT: Dr. Clotilde Dieterhris Clark    ANESTHESIA: General  EBL: per anesthesia record  Total I/O In: 1750 [I.V.:1750] Out: 530 [Urine:450; Blood:80]  BLOOD ADMINISTERED: none  DRAINS: none  SPECIMEN: none  COUNTS CORRECT:  YES  PATIENT DISPOSITION:  PACU - hemodynamically stable  PROCEDURE DETAILS: The patient was taken to the operating room placed supine position where the area of the abdomen was prepped and draped in usual sterile fashion.  C-arm was used to visualize the level of the 4 5 disc in relationship to the skin surface.  Incision was made over this level.  The patient had transitional anatomy.  The rectus muscle was mobilized circumferentially and the retroperitoneal space was entered in the left lower quadrant.  The personnel sac was mobilized to the right and the left ureter was identified and mobilized to the right.  Detection dissection was continued above the level of the psoas muscle.  The L4-5 disc was isolated between the level of the iliac arteries after the aortic bifurcation.  Blunt dissection over the L4-5 disc was continued to give adequate exposure laterally.  The middle sacral vessels were clipped and divided.  The Thompson retractor was brought onto the field and the reverse lip blades were positioned to the right and left of the disc in the malleable 140 disc were positioned for superior and inferior exposure.  Spinal needle was positioned in the L4-5 disc and C-arm was brought back onto the field to confirm that this was the appropriate level.  The remainder of the procedure will be dictated as a separate note by Dr.  Jethro BastosBrooks   Rennie Hack F. Curley Hogen, M.D., [facility] Center[facility] 12/01/2017 3:09 PM

## 2017-12-01 NOTE — Anesthesia Procedure Notes (Signed)
Procedure Name: Intubation Date/Time: 12/01/2017 8:41 AM Performed by: Moshe Salisbury, CRNA Pre-anesthesia Checklist: Patient identified, Emergency Drugs available, Suction available and Patient being monitored Patient Re-evaluated:Patient Re-evaluated prior to induction Oxygen Delivery Method: Circle System Utilized Preoxygenation: Pre-oxygenation with 100% oxygen Induction Type: IV induction Ventilation: Mask ventilation without difficulty Laryngoscope Size: Mac and 3 Grade View: Grade II Tube type: Oral Tube size: 7.5 mm Number of attempts: 1 Airway Equipment and Method: Stylet Placement Confirmation: ETT inserted through vocal cords under direct vision,  positive ETCO2 and breath sounds checked- equal and bilateral Secured at: 23 cm Tube secured with: Tape Dental Injury: Teeth and Oropharynx as per pre-operative assessment

## 2017-12-01 NOTE — Evaluation (Signed)
Physical Therapy Evaluation Patient Details Name: Allison Stevenson Miao MRN: [id number] DOB: 02-Jul-1960 Today's Date: 12/01/2017   History of Present Illness  Pt is a 57 y/o female s/p L4-5 ALIF and L2-4 lateral interbody fusion. Pt likely to OR tomorrow for part 2 of surgery. PMH includes thoracic outlet.   Clinical Impression  Pt is s/p surgery above with deficits below. RN requesting assist to transfer pt to room upon arrival to unit. Pt very lethargic and requiring mod-max A +2 to perform stand [facility]. Pt with BLE knee buckling and required manual assist to block knees. Further mobility limited. Anticipate pt will progress well once lethargy improves. Will continue to follow acutely to maximize functional mobility independence and safety.     Follow Up Recommendations Other (comment)(TBD pending further surgery )    Equipment Recommendations  Other (comment)(TBD pending further surgery )    Recommendations for Other Services       Precautions / Restrictions Precautions Precautions: Back Precaution Booklet Issued: No Precaution Comments: Reviewed back precautions, however, pt very lethargic, and will likely need review.  Required Braces or Orthoses: Spinal Brace Spinal Brace: Lumbar corset Restrictions Weight Bearing Restrictions: No      Mobility  Bed Mobility Overal bed mobility: Needs Assistance Bed Mobility: Sit to Sidelying;Rolling Rolling: Mod assist       Sit to sidelying: Min assist General bed mobility comments: Pt recieved at EOB with RN present. RN requesting assist with transferring pt into room. Mod A for controlled descent into sidelying. Pt unable to follow cues for log roll technique, and required manual assist. Min A for controlled rolling onto back.   Transfers Overall transfer level: Needs assistance Equipment used: 2 person hand held assist Transfers: Sit to/from [facility] CorporationStand;Stand Pivot Transfers Sit to Stand: Mod assist;Max  assist;+2 physical assistance Stand pivot transfers: Mod assist;Max assist;+2 physical assistance       General transfer comment: Pt stood X3. Unsafe to ambulate secondary to bilat knee buckling therefore performed stand pivot X 2 to WC and then from [facility]. Required mod to max A +2 for steadying assist and manual blocking at knees. Further mobility deferred.   Ambulation/Gait                Stairs            Wheelchair Mobility    Modified Rankin (Stroke Patients Only)       Balance Overall balance assessment: Needs assistance Sitting-balance support: Bilateral upper extremity supported;Feet supported Sitting balance-Leahy Scale: Poor Sitting balance - Comments: Reliant on UE support and external support secondary to lethargy.    Standing balance support: Bilateral upper extremity supported Standing balance-Leahy Scale: Zero Standing balance comment: required mod to max a +2 for steadying during mobility.                              Pertinent Vitals/Pain Pain Assessment: Faces Faces Pain Scale: Hurts a little bit Pain Location: back  Pain Descriptors / Indicators: Grimacing;Guarding Pain Intervention(s): Limited activity within patient's tolerance;Monitored during session;Repositioned    Home Living Family/patient expects to be discharged to:: Private residence Living Arrangements: Parent;Children Available Help at Discharge: Family Type of Home: House Home Access: Stairs to enter Entrance Stairs-Rails: Left Entrance Stairs-Number of Steps: 4 Home Layout: Two level Home Equipment: Walker - 2 wheels      Prior Function Level of Independence: Independent  Hand Dominance        Extremity/Trunk Assessment   Upper Extremity Assessment Upper Extremity Assessment: Defer to OT evaluation    Lower Extremity Assessment Lower Extremity Assessment: Generalized weakness;RLE deficits/detail;LLE deficits/detail RLE  Deficits / Details: Reports numbness in feet. Noted buckling at knees and required manual blocking bilaterally during transfer.  LLE Deficits / Details: Reports numbness in feet. Noted buckling at knees and required manual blocking bilaterally during transfer.     Cervical / Trunk Assessment Cervical / Trunk Assessment: Other exceptions Cervical / Trunk Exceptions: s/p lumbar surgery  Communication   Communication: No difficulties  Cognition Arousal/Alertness: Lethargic;Suspect due to medications Behavior During Therapy: Flat affect Overall Cognitive Status: Difficult to assess                                 General Comments: Pt very sleepy and groggy following surgery. Suspect secondary to medications.       General Comments      Exercises     Assessment/Plan    PT Assessment Patient needs continued PT services  PT Problem List Decreased strength;Decreased balance;Decreased activity tolerance;Decreased mobility;Decreased knowledge of use of DME;Decreased safety awareness;Decreased knowledge of precautions;Pain       PT Treatment Interventions DME instruction;Gait training;Stair training;Functional mobility training;Therapeutic activities;Therapeutic exercise;Balance training;Patient/family education    PT Goals (Current goals can be found in the Care Plan section)  Acute Rehab PT Goals Patient Stated Goal: none stated  PT Goal Formulation: Patient unable to participate in goal setting Time For Goal Achievement: 12/15/17 Potential to Achieve Goals: Good    Frequency Min 5X/week   Barriers to discharge        Co-evaluation               AM-PAC PT "6 Clicks" Daily Activity  Outcome Measure Difficulty turning over in bed (including adjusting bedclothes, sheets and blankets)?: Unable Difficulty moving from lying on back to sitting on the side of the bed? : Unable Difficulty sitting down on and standing up from a chair with arms (e.g., wheelchair,  bedside commode, etc,.)?: Unable Help needed moving to and from a bed to chair (including a wheelchair)?: A Lot Help needed walking in hospital room?: Total Help needed climbing 3-5 steps with a railing? : Total 6 Click Score: 7    End of Session Equipment Utilized During Treatment: Gait belt Activity Tolerance: Patient limited by lethargy Patient left: in bed;with call bell/phone within reach;with nursing/sitter in room Nurse Communication: Mobility status PT Visit Diagnosis: Unsteadiness on feet (R26.81);Other abnormalities of gait and mobility (R26.89);Muscle weakness (generalized) (M62.81)    Time: [id number] PT Time Calculation (min) (ACUTE ONLY): 11 min   Charges:   PT Evaluation $PT Eval Moderate Complexity: 1 Mod          Gladys DammeBrittany Cailah Reach, PT, DPT  Acute Rehabilitation Services  Pager: [phone]   Lehman PromBrittany S Lane Eland 12/01/2017, 6:26 PM

## 2017-12-01 NOTE — Op Note (Signed)
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  Postoperative diagnosis: Same  Operative procedure: 1.  Anterior lumbar interbody fusion (ALIF) L4-5    2.  Lateral interbody fusion (XLIF) L2-4  Complications: None  Anterior approach Surgeon: Dr's: Allison Stevenson and Allison Stevenson First assistant: Allison Riedelarmen Mayo, PA  Implants: Titan endoskeleton inner vertebral cage.  40 x 27 x 14mm 12 degree lordotic cage.  Single locking screw 30 mm length placed into the body of S1  Allograft: Vivogen and Osteocel  Nuvasive lateral interbody 3D mesh cages 12 x 18 x 55 10 degree lordosis x 2  Intraoperative neuro monitoring: No adverse events throughout the case.  Normal SSEP and EMG activity.  Indications: Allison Stevenson is a very pleasant active 57 year old young lady who has had progressive debilitating back pain with intermittent radiation into the lower extremity.  Attempts at conservative management had failed to alleviate her symptoms, and she has had progressive loss in quality of life.  As a result we elected to proceed with surgery.  Today was the first stage procedure which was the anterior interbody fusion at L2-3, L3-4, and L4-5.  Surgical plan is to return to the operating room tomorrow for posterior supplemental pedicle screw fixation.  All appropriate risks benefits and alternatives to surgery were discussed with the patient and her husband and consent was obtained.  Operative procedure: Patient was brought the operating room placed by the operating room table.  After successful induction of general anesthesia and endotracheally patient teds SCDs and a Foley were inserted.  The anterior abdomen was prepped and draped in a standard fashion.  Timeout was taken to confirm patient procedure and all other important data.  At this point time Dr. Tawanna Coolerodd Stevenson and Dr. Clotilde Dieterhris Stevenson scrubbed in and performed a standard  anterior retroperitoneal approach to the lumbar spine.  Please refer to their dictation for specifics.  Once the retractor system was then placed in the appropriate disc was exposed myself and my assistant Allison Stevenson scrubbed in.  X-rays were taken to confirm that I was at the appropriate level.  Intraoperative fluoroscopy views were compared to the preoperative MRI and x-rays.  Once the appropriate level was confirmed a second timeout was taken again to confirm procedure levels and all other important data.  Annulotomy was performed with a 10 blade scalpel and then I used a Cobb elevator to strip the disc and exposed the subchondral bone.  Pituitary rongeurs curettes and Kerrison rongeurs were used to remove all of the disc material.  Once I had the bulk of the disc material out I then distracted the intervertebral spac and began resecting the spurs from the posterior aspect of the L5 and L4 vertebral bodies.  Using a fine nerve hook I dissected through the posterior annulus and release this with a 2 mm Kerrison rongeur.  At this point I had a complete discectomy.  Live fluoroscopy lateral view was used to confirm that I had parallel endplate distraction and there was no fishmouthing.  At this point I was pleased with the overall discectomy.  I rasped the endplates so I made sure had bleeding subchondral bone.  I then used the rasp trial spacers and elected to use a size 14 mm high extra-large 12 degree lordotic cage.  This provided the best overall distraction of the intervertebral space, restoration of the foraminal volume and overall stability.  The implant was obtained and packed with the allograft (vivogen)  and malleted to the appropriate depth.  I confirmed satisfactory overall alignment in both the AP and lateral planes using fluoroscopy.  Once this was done I used the awl to broach the cortex and then placed a single locking screw to prevent this anterior migration of the cage.  The screw was placed  through the cage and into the S1 vertebral body.  At this point I was pleased with the overall fixation and stability.  The total retraction time was approximately 47 minutes.  The retractors were sequentially released to confirm hemostasis.  prior to removing the retractors the wound was copiously irrigated with normal saline.  With the retractors removed I closed the fascia of the rectus with a running #1 PDS suture.  Once this was secured I irrigated again and then closed in a layered fashion with interrupted #1 Vicryl suture, 2-0 Vicryl suture, and 3-0 Monocryl.  Steri-Strips and a dry dressing were then applied.  At this point final AP and lateral fluoroscopy views were taken which demonstrated satisfactory overall positioning of the hardware.  At this point the drapes were taken down and gets were applied to the patient to keep her warm.  The new OR table was brought into the surgical suite and the patient was transferred to the new bed.  Once properly placed on the bed she was turned into the decubitus position left side up.  Axillary roll was placed in the knee and ankle were padded.  The arms were properly padded and the left arm was placed on the elevated arm holder.  At this point with the patient in lateral decubitus position I then proceeded to take x-rays at L2-3 and L3-4 to confirm I had proper overall positioning of the patient.  Once confirmed the patient was secured directly to the table with tape.  The lower extremity and torso were secured.  With the patient secured I prepped out the area and draped.  Lateral fluoroscopy was used to identify the incision site which is at the midportion of the L3 vertebral body.  Lateral incision was made and sharp dissection was carried out down to the fascia of the external oblique.  A second small incision was made approximately a finger length away from the lateral incision posteriorly.  I then bluntly dissected down to the posterior retroperitoneal fascia  and then entered the retroperitoneum and then began bluntly dissecting at this point I was able to palpate the surface of the psoas muscle as well as the undersurface of the external oblique.  I bluntly dissected through the external oblique until I could see my finger in the original transverse incision.  At this point the initial dilator was placed into the wound and down onto the lateral aspect of the spine.  I confirmed satisfactory position with AP and lateral fluoroscopy and then stimulated circumferentially to ensure I was not endangering the lumbar plexus.  Once this was confirmed I went through the psoas down to the lateral aspect of the disc space.  A guidepin was advanced to secured into position  I then ambulated again circumferentially to ensure that I was not endangering or traumatizing the lumbar plexus.  I then sequentially dilated and with each increase stimulated circumferentially.  Once I had the final dilator in I then placed the working trocar.  I then stimulated behind the working trocar to ensure the plexus was not traumatized as well as inferiorly and superiorly.  I then gently manipulated the retractor until it was properly positioned  on the lateral aspect of the disc space.  The trocar was then advanced through the posterior blade into the disc space.  I could now visualize the lateral aspect of the L2-3 disc space.  I again used the stimulating probe to ensure that the plexus was not in any danger or being traumatized by my retractor.  An annulotomy was performed with a 10 blade scalpel and using a Cobb elevator I stripped the disc material off of the endplate and release the contralateral annulus.  I then removed the disc material with a box osteotome, curettes, and Kerrison rongeurs.  I rasped the endplates so I had bleeding subchondral bone.  At this point I had an adequate channel discectomy.  The disc was removed in both the anterior and posterior longitudinal ligaments still  remained intact.  I then began sequentially trialing starting at the 8 x 18 lordotic spacer.  The 12 x 18 lordotic spacer provided the best overall distraction and fit in the disc space.  As such I obtained the 3D mesh cage impacted with allograft (osteocel and vivogen).  The wound was irrigated and all loose material the disc was removed.  I malleted the cage to the appropriate depth.  Cage itself had excellent fixation and actually noted improvement of the lateral listhesis.  At this point I irrigated the wound copiously normal saline and used bipolar cautery to obtain hemostasis.  The retracting system was removed.  Total retraction time was noted to be 30 minutes.  At this point with the L2-3 level complete I then proceeded to the L3-4 level.  Using the same technique I just utilizing the L2-3, I repositioned the initial dilating tube at the L3-4 level.  I used the same exact technique at this level that I utilized at L2-3.  With each dilating trocar stimulated circumferentially and when the final working retractor was then I stimulated circumferentially with the probe each time confirming that there was no direct trauma to the lumbar plexus.  In addition there was no adverse free running EMG activity noted.    With the L3-4 disc exposed and annulotomy was performed with a 10 blade scalpel and again using the box osteotome, curettes and pituitary rongeurs I removed all of the disc material.  The dural osteophyte was also resected.  I used the same cage at this level that I used at L2-3.  He was obtained packed with the allograft and malleted to the appropriate depth.  At this point x-rays were taken which demonstrated satisfactory position of all 3 intervertebral cages.  The overall alignment of the spine was improved.  At this point time the small incision as well as the lateral incision was irrigated copiously normal saline and stasis was obtained using bipolar electrocautery and FloSeal.  The external  oblique fascia was closed with interrupted #1 Vicryl suture.  I then closed the remainder of both wounds in a layered fashion with interrupted 2-0 Vicryl suture and 3-0 Monocryl.  Steri-Strips dry dressings were applied.  The patient was ultimately extubated transfer the PACU without incident.  The end of the case all needle sponge counts were correct.  There were no adverse intraoperative events.  Following the anterior procedure the abdominal x-ray was read as negative I the radiologist.  Patient will return to the OR tomorrow for posterior pedicle screw fixation L2-5.

## 2017-12-01 NOTE — Transfer of Care (Signed)
Immediate Anesthesia Transfer of Care Note  Patient: Allison Stevenson  Procedure(s) Performed: Anterior lumbar fusion L4-5, anterior lateral lumbar fusion L2-4 (N/A Back) ANTERIOR LATERAL LUMBAR FUSION 2 LEVELS (N/A Back) ABDOMINAL EXPOSURE (N/A Back)  Patient Location: PACU  Anesthesia Type:General  Level of Consciousness: drowsy and patient cooperative  Airway & Oxygen Therapy: Patient Spontanous Breathing and Patient connected to nasal cannula oxygen  Post-op Assessment: Report given to RN, Post -op Vital signs reviewed and stable and Patient moving all extremities  Post vital signs: Reviewed and stable  Last Vitals:  Vitals Value Taken Time  BP 93/81 12/01/2017  3:07 PM  Temp    Pulse 82 12/01/2017  3:08 PM  Resp 12 12/01/2017  3:08 PM  SpO2 100 % 12/01/2017  3:08 PM  Vitals shown include unvalidated device data.  Last Pain:  Vitals:   12/01/17 0717  TempSrc:   PainSc: 2       Patients Stated Pain Goal: 4 (12/01/17 0717)  Complications: No apparent anesthesia complications

## 2017-12-01 NOTE — Brief Op Note (Signed)
12/01/2017  2:48 PM  PATIENT:  Allison Stevenson  57 y.o. female  PRE-OPERATIVE DIAGNOSIS:  Degenerative scoliosis, lateral listhesis with degenerative disc disease  POST-OPERATIVE DIAGNOSIS:  Degenerative scoliosis, lateral listhesis with degenerative disc   PROCEDURE:  Procedure(s) with comments: Anterior lumbar fusion L4-5, anterior lateral lumbar fusion L2-4 (N/A) - Requests 6 hrs for both, Dr, Early to do Approach ANTERIOR LATERAL LUMBAR FUSION 2 LEVELS (N/A) ABDOMINAL EXPOSURE (N/A)  SURGEON:  Surgeon(s) and Role: Panel 1:    Venita Lick* Elain Wixon, MD - Primary Panel 2:    * Early, Kristen Loaderodd F, MD - Primary  PHYSICIAN ASSISTANT:   ASSISTANTS: Carmen Mayo   ANESTHESIA:   general  EBL:  80 mL   BLOOD ADMINISTERED:none  DRAINS: none   LOCAL MEDICATIONS USED:  NONE  SPECIMEN:  No Specimen  DISPOSITION OF SPECIMEN:  N/A  COUNTS:  YES  TOURNIQUET:  * No tourniquets in log *  DICTATION: .Dragon Dictation  PLAN OF CARE: Admit to inpatient   PATIENT DISPOSITION:  PACU - hemodynamically stable.

## 2017-12-01 NOTE — Anesthesia Postprocedure Evaluation (Signed)
Anesthesia Post Note  Patient: Allison Stevenson  Procedure(s) Performed: Anterior lumbar fusion L4-5, anterior lateral lumbar fusion L2-4 (N/A Back) ANTERIOR LATERAL LUMBAR FUSION 2 LEVELS (N/A Back) ABDOMINAL EXPOSURE (N/A Back)     Patient location during evaluation: PACU Anesthesia Type: General Level of consciousness: awake and alert Pain management: pain level controlled Vital Signs Assessment: post-procedure vital signs reviewed and stable Respiratory status: spontaneous breathing, nonlabored ventilation and respiratory function stable Cardiovascular status: blood pressure returned to baseline and stable Postop Assessment: no apparent nausea or vomiting Anesthetic complications: no    Last Vitals:  Vitals:   12/01/17 1607 12/01/17 1622  BP: 124/81 119/75  Pulse: 80 74  Resp: 11 10  Temp:    SpO2: 100% 100%    Last Pain:  Vitals:   12/01/17 0717  TempSrc:   PainSc: 2                  Lowella CurbWarren Ray Tammie Ellsworth

## 2017-12-01 NOTE — Anesthesia Preprocedure Evaluation (Addendum)
Anesthesia Evaluation  Patient identified by MRN, date of birth, ID band Patient awake    Reviewed: Allergy & Precautions, NPO status , Patient's Chart, lab work & pertinent test results  Airway Mallampati: II  TM Distance: >3 FB Neck ROM: Full    Dental no notable dental hx. (+) Teeth Intact, Dental Advisory Given   Pulmonary    Pulmonary exam normal breath sounds clear to auscultation       Cardiovascular Normal cardiovascular exam Rhythm:Regular Rate:Normal     Neuro/Psych  Headaches,    GI/Hepatic   Endo/Other    Renal/GU      Musculoskeletal  (+) Arthritis ,   Abdominal   Peds  Hematology   Anesthesia Other Findings   Reproductive/Obstetrics                            Anesthesia Physical Anesthesia Plan  ASA: II  Anesthesia Plan: General   Post-op Pain Management:    Induction: Intravenous  PONV Risk Score and Plan: 3 and Ondansetron, Dexamethasone and Midazolam  Airway Management Planned: Oral ETT  Additional Equipment: None  Intra-op Plan:   Post-operative Plan: Extubation in OR  Informed Consent: I have reviewed the patients History and Physical, chart, labs and discussed the procedure including the risks, benefits and alternatives for the proposed anesthesia with the patient or authorized representative who has indicated his/her understanding and acceptance.   Dental advisory given  Plan Discussed with: CRNA, Anesthesiologist and Surgeon  Anesthesia Plan Comments:        Anesthesia Quick Evaluation

## 2017-12-01 NOTE — Plan of Care (Signed)
  Problem: Safety: Goal: Ability to remain free from injury will improve Outcome: Progressing   

## 2017-12-02 ENCOUNTER — Encounter (HOSPITAL_COMMUNITY)
Admission: RE | Disposition: A | Discharge status: Home Health | Payer: Self-pay | Source: Ambulatory Visit | Attending: Orthopedic Surgery

## 2017-12-02 ENCOUNTER — Inpatient Hospital Stay (HOSPITAL_COMMUNITY): Payer: Worker's Compensation | Admitting: Anesthesiology

## 2017-12-02 ENCOUNTER — Inpatient Hospital Stay (HOSPITAL_COMMUNITY): Payer: Worker's Compensation

## 2017-12-02 ENCOUNTER — Inpatient Hospital Stay (HOSPITAL_COMMUNITY)
Admission: RE | Admit: 2017-12-02 | Payer: Worker's Compensation | Source: Ambulatory Visit | Admitting: Orthopedic Surgery

## 2017-12-02 ENCOUNTER — Encounter (HOSPITAL_COMMUNITY): Payer: Self-pay | Admitting: Anesthesiology

## 2017-12-02 SURGERY — POSTERIOR LUMBAR FUSION 3 LEVEL
Anesthesia: General

## 2017-12-02 MED ORDER — PHENYLEPHRINE HCL 10 MG/ML IJ SOLN
INTRAMUSCULAR | Status: DC | PRN
  Administered 2017-12-02 (×4): 80 ug via INTRAVENOUS

## 2017-12-02 MED ORDER — PROPOFOL 1000 MG/100ML IV EMUL
INTRAVENOUS | Status: AC
  Filled 2017-12-02: qty 300

## 2017-12-02 MED ORDER — CEFAZOLIN SODIUM-DEXTROSE 2-4 GM/100ML-% IV SOLN
2.0000 g | Freq: Three times a day (TID) | INTRAVENOUS | Status: AC
  Administered 2017-12-02 – 2017-12-03 (×2): 2 g via INTRAVENOUS
  Filled 2017-12-02 (×2): qty 100

## 2017-12-02 MED ORDER — ROCURONIUM BROMIDE 50 MG/5ML IV SOSY
PREFILLED_SYRINGE | INTRAVENOUS | Status: AC
  Filled 2017-12-02: qty 5

## 2017-12-02 MED ORDER — CEFAZOLIN SODIUM-DEXTROSE 2-4 GM/100ML-% IV SOLN: 2.0000 g | Freq: Three times a day (TID) | INTRAVENOUS | Status: DC

## 2017-12-02 MED ORDER — BUPIVACAINE-EPINEPHRINE (PF) 0.25% -1:200000 IJ SOLN
INTRAMUSCULAR | Status: AC
  Filled 2017-12-02: qty 30

## 2017-12-02 MED ORDER — SODIUM CHLORIDE 0.9 % IV SOLN
INTRAVENOUS | Status: DC | PRN
  Administered 2017-12-02: 25 ug/min via INTRAVENOUS

## 2017-12-02 MED ORDER — LACTATED RINGERS IV SOLN: INTRAVENOUS | Status: DC

## 2017-12-02 MED ORDER — PROPOFOL 10 MG/ML IV BOLUS
INTRAVENOUS | Status: DC | PRN
  Administered 2017-12-02: 90 mg via INTRAVENOUS

## 2017-12-02 MED ORDER — ACETAMINOPHEN 650 MG RE SUPP: 650.0000 mg | RECTAL | Status: DC | PRN

## 2017-12-02 MED ORDER — ROCURONIUM BROMIDE 100 MG/10ML IV SOLN
INTRAVENOUS | Status: DC | PRN
  Administered 2017-12-02: 40 mg via INTRAVENOUS

## 2017-12-02 MED ORDER — OXYCODONE-ACETAMINOPHEN 10-325 MG PO TABS: 1.0000 | ORAL_TABLET | Freq: Four times a day (QID) | ORAL | 0 refills | Status: AC | PRN

## 2017-12-02 MED ORDER — DEXAMETHASONE SODIUM PHOSPHATE 10 MG/ML IJ SOLN
INTRAMUSCULAR | Status: AC
  Filled 2017-12-02: qty 1

## 2017-12-02 MED ORDER — EPHEDRINE SULFATE 50 MG/ML IJ SOLN
INTRAMUSCULAR | Status: DC | PRN
  Administered 2017-12-02: 10 mg via INTRAVENOUS

## 2017-12-02 MED ORDER — PROMETHAZINE HCL 25 MG/ML IJ SOLN: 6.2500 mg | INTRAMUSCULAR | Status: DC | PRN

## 2017-12-02 MED ORDER — LACTATED RINGERS IV SOLN
INTRAVENOUS | Status: DC | PRN
  Administered 2017-12-02 (×2): via INTRAVENOUS

## 2017-12-02 MED ORDER — PHENOL 1.4 % MT LIQD: 1.0000 | OROMUCOSAL | Status: DC | PRN

## 2017-12-02 MED ORDER — STERILE WATER FOR IRRIGATION IR SOLN
Status: DC | PRN
  Administered 2017-12-02: 1000 mL

## 2017-12-02 MED ORDER — EPHEDRINE 5 MG/ML INJ
INTRAVENOUS | Status: AC
  Filled 2017-12-02: qty 10

## 2017-12-02 MED ORDER — MENTHOL 3 MG MT LOZG: 1.0000 | LOZENGE | OROMUCOSAL | Status: DC | PRN

## 2017-12-02 MED ORDER — MIDAZOLAM HCL 5 MG/5ML IJ SOLN
INTRAMUSCULAR | Status: DC | PRN
  Administered 2017-12-02 (×2): 1 mg via INTRAVENOUS

## 2017-12-02 MED ORDER — LIDOCAINE 2% (20 MG/ML) 5 ML SYRINGE
INTRAMUSCULAR | Status: DC | PRN
  Administered 2017-12-02 (×2): 50 mg via INTRAVENOUS

## 2017-12-02 MED ORDER — ONDANSETRON HCL 4 MG/2ML IJ SOLN
INTRAMUSCULAR | Status: AC
  Filled 2017-12-02: qty 2

## 2017-12-02 MED ORDER — ACETAMINOPHEN 10 MG/ML IV SOLN
INTRAVENOUS | Status: AC
  Filled 2017-12-02: qty 100

## 2017-12-02 MED ORDER — ACETAMINOPHEN 325 MG PO TABS: 650.0000 mg | ORAL_TABLET | ORAL | Status: DC | PRN

## 2017-12-02 MED ORDER — HYDROMORPHONE HCL 1 MG/ML IJ SOLN
INTRAMUSCULAR | Status: AC
  Filled 2017-12-02: qty 1

## 2017-12-02 MED ORDER — SODIUM CHLORIDE 0.9% FLUSH
3.0000 mL | Freq: Two times a day (BID) | INTRAVENOUS | Status: DC
  Administered 2017-12-02 – 2017-12-04 (×2): 3 mL via INTRAVENOUS

## 2017-12-02 MED ORDER — MEPERIDINE HCL 50 MG/ML IJ SOLN
6.2500 mg | INTRAMUSCULAR | Status: DC | PRN
Start: 2017-12-02 — End: 2017-12-02

## 2017-12-02 MED ORDER — ONDANSETRON HCL 4 MG/2ML IJ SOLN
INTRAMUSCULAR | Status: DC | PRN
  Administered 2017-12-02: 4 mg via INTRAVENOUS

## 2017-12-02 MED ORDER — PROPOFOL 10 MG/ML IV BOLUS
INTRAVENOUS | Status: AC
  Filled 2017-12-02: qty 40

## 2017-12-02 MED ORDER — SODIUM CHLORIDE 0.9% FLUSH: 3.0000 mL | INTRAVENOUS | Status: DC | PRN

## 2017-12-02 MED ORDER — CEFAZOLIN SODIUM-DEXTROSE 2-3 GM-%(50ML) IV SOLR
INTRAVENOUS | Status: DC | PRN
  Administered 2017-12-02: 2 g via INTRAVENOUS

## 2017-12-02 MED ORDER — 0.9 % SODIUM CHLORIDE (POUR BTL) OPTIME
TOPICAL | Status: DC | PRN
  Administered 2017-12-02: 1000 mL

## 2017-12-02 MED ORDER — SURGIFOAM 100 EX MISC
CUTANEOUS | Status: DC | PRN
  Administered 2017-12-02: 1 via TOPICAL

## 2017-12-02 MED ORDER — SUGAMMADEX SODIUM 200 MG/2ML IV SOLN
INTRAVENOUS | Status: DC | PRN
  Administered 2017-12-02: 200 mg via INTRAVENOUS

## 2017-12-02 MED ORDER — DIAZEPAM 5 MG PO TABS
5.0000 mg | ORAL_TABLET | Freq: Four times a day (QID) | ORAL | Status: DC | PRN
  Administered 2017-12-02 – 2017-12-03 (×3): 5 mg via ORAL
  Filled 2017-12-02 (×4): qty 1

## 2017-12-02 MED ORDER — PROPOFOL 500 MG/50ML IV EMUL
INTRAVENOUS | Status: DC | PRN
  Administered 2017-12-02: 25 ug/kg/min via INTRAVENOUS

## 2017-12-02 MED ORDER — ONDANSETRON 4 MG PO TBDP: 4.0000 mg | ORAL_TABLET | Freq: Three times a day (TID) | ORAL | 0 refills | Status: DC | PRN

## 2017-12-02 MED ORDER — ROCURONIUM BROMIDE 50 MG/5ML IV SOSY
PREFILLED_SYRINGE | INTRAVENOUS | Status: DC | PRN
  Administered 2017-12-02: 40 mg via INTRAVENOUS

## 2017-12-02 MED ORDER — SODIUM CHLORIDE 0.9 % IV SOLN: 250.0000 mL | INTRAVENOUS | Status: DC

## 2017-12-02 MED ORDER — SCOPOLAMINE 1 MG/3DAYS TD PT72
MEDICATED_PATCH | TRANSDERMAL | Status: DC | PRN
  Administered 2017-12-02: 1 via TRANSDERMAL

## 2017-12-02 MED ORDER — DEXAMETHASONE SODIUM PHOSPHATE 10 MG/ML IJ SOLN
INTRAMUSCULAR | Status: DC | PRN
  Administered 2017-12-02: 10 mg via INTRAVENOUS

## 2017-12-02 MED ORDER — THROMBIN (RECOMBINANT) 20000 UNITS EX SOLR
CUTANEOUS | Status: AC
  Filled 2017-12-02: qty 20000

## 2017-12-02 MED ORDER — SUCCINYLCHOLINE CHLORIDE 200 MG/10ML IV SOSY
PREFILLED_SYRINGE | INTRAVENOUS | Status: AC
  Filled 2017-12-02: qty 10

## 2017-12-02 MED ORDER — MIDAZOLAM HCL 2 MG/2ML IJ SOLN
INTRAMUSCULAR | Status: AC
  Filled 2017-12-02: qty 4

## 2017-12-02 MED ORDER — PHENYLEPHRINE 40 MCG/ML (10ML) SYRINGE FOR IV PUSH (FOR BLOOD PRESSURE SUPPORT)
PREFILLED_SYRINGE | INTRAVENOUS | Status: AC
  Filled 2017-12-02: qty 10

## 2017-12-02 MED ORDER — THROMBIN 20000 UNITS EX SOLR
CUTANEOUS | Status: DC | PRN
  Administered 2017-12-02: 09:00:00 via TOPICAL

## 2017-12-02 MED ORDER — ACETAMINOPHEN 10 MG/ML IV SOLN
INTRAVENOUS | Status: DC | PRN
  Administered 2017-12-02: 1000 mg via INTRAVENOUS

## 2017-12-02 MED ORDER — PROPOFOL 500 MG/50ML IV EMUL
INTRAVENOUS | Status: AC
  Filled 2017-12-02: qty 150

## 2017-12-02 MED ORDER — MEPERIDINE HCL 50 MG/ML IJ SOLN
INTRAMUSCULAR | Status: AC
  Filled 2017-12-02: qty 1

## 2017-12-02 MED ORDER — BUPIVACAINE-EPINEPHRINE 0.25% -1:200000 IJ SOLN
INTRAMUSCULAR | Status: DC | PRN
  Administered 2017-12-02: 20 mL

## 2017-12-02 MED ORDER — FENTANYL CITRATE (PF) 250 MCG/5ML IJ SOLN
INTRAMUSCULAR | Status: AC
  Filled 2017-12-02: qty 10

## 2017-12-02 MED ORDER — FENTANYL CITRATE (PF) 100 MCG/2ML IJ SOLN
INTRAMUSCULAR | Status: DC | PRN
  Administered 2017-12-02 (×5): 50 ug via INTRAVENOUS

## 2017-12-02 MED ORDER — HYDROMORPHONE HCL 1 MG/ML IJ SOLN
0.2500 mg | INTRAMUSCULAR | Status: DC | PRN
  Administered 2017-12-02 (×3): 0.5 mg via INTRAVENOUS

## 2017-12-02 MED ORDER — LIDOCAINE 2% (20 MG/ML) 5 ML SYRINGE
INTRAMUSCULAR | Status: AC
  Filled 2017-12-02: qty 5

## 2017-12-02 MED FILL — Heparin Sodium (Porcine) Inj 1000 Unit/ML: INTRAMUSCULAR | Qty: 30 | Status: AC

## 2017-12-02 MED FILL — Sodium Chloride IV Soln 0.9%: INTRAVENOUS | Qty: 1000 | Status: AC

## 2017-12-02 SURGICAL SUPPLY — 75 items
BLADE CLIPPER SURG (BLADE) IMPLANT
BUR EGG ELITE 4.0 (BURR) IMPLANT
CABLE BIPOLOR RESECTION CORD (MISCELLANEOUS) ×2 IMPLANT
CLIP NEUROVISION LG (CLIP) ×1 IMPLANT
CLSR STERI-STRIP ANTIMIC 1/2X4 (GAUZE/BANDAGES/DRESSINGS) ×2 IMPLANT
COVER MAYO STAND STRL (DRAPES) ×4 IMPLANT
COVER SURGICAL LIGHT HANDLE (MISCELLANEOUS) ×2 IMPLANT
DRAPE C-ARM 42X72 X-RAY (DRAPES) ×4 IMPLANT
DRAPE C-ARMOR (DRAPES) ×2 IMPLANT
DRAPE INCISE IOBAN 66X45 STRL (DRAPES) ×1 IMPLANT
DRAPE POUCH INSTRU U-SHP 10X18 (DRAPES) ×2 IMPLANT
DRAPE SURG 17X23 STRL (DRAPES) ×2 IMPLANT
DRAPE U-SHAPE 47X51 STRL (DRAPES) ×2 IMPLANT
DRSG OPSITE POSTOP 4X8 (GAUZE/BANDAGES/DRESSINGS) ×3 IMPLANT
DURAPREP 26ML APPLICATOR (WOUND CARE) ×3 IMPLANT
ELECT BLADE 4.0 EZ CLEAN MEGAD (MISCELLANEOUS)
ELECT BLADE 6.5 EXT (BLADE) ×2 IMPLANT
ELECT CAUTERY BLADE 6.4 (BLADE) ×2 IMPLANT
ELECT PENCIL ROCKER SW 15FT (MISCELLANEOUS) ×2 IMPLANT
ELECT REM PT RETURN 9FT ADLT (ELECTROSURGICAL) ×2
ELECTRODE BLDE 4.0 EZ CLN MEGD (MISCELLANEOUS) IMPLANT
ELECTRODE REM PT RTRN 9FT ADLT (ELECTROSURGICAL) ×1 IMPLANT
GLOVE BIOGEL PI IND STRL 8.5 (GLOVE) ×1 IMPLANT
GLOVE BIOGEL PI INDICATOR 8.5 (GLOVE) ×1
GLOVE SS BIOGEL STRL SZ 8.5 (GLOVE) ×1 IMPLANT
GLOVE SUPERSENSE BIOGEL SZ 8.5 (GLOVE) ×1
GOWN STRL REUS W/ TWL LRG LVL3 (GOWN DISPOSABLE) ×1 IMPLANT
GOWN STRL REUS W/TWL 2XL LVL3 (GOWN DISPOSABLE) ×4 IMPLANT
GOWN STRL REUS W/TWL LRG LVL3 (GOWN DISPOSABLE) ×2
GUIDEWIRE NITINOL BEVEL TIP (WIRE) ×8 IMPLANT
KIT BASIN OR (CUSTOM PROCEDURE TRAY) ×2 IMPLANT
KIT POSITION SURG JACKSON T1 (MISCELLANEOUS) ×2 IMPLANT
KIT TURNOVER KIT B (KITS) ×2 IMPLANT
MODULE EMG NDL SSEP NVM5 (NEEDLE) IMPLANT
MODULE EMG NEEDLE SSEP NVM5 (NEEDLE) ×2 IMPLANT
MODULE NVM5 NEXT GEN EMG (NEEDLE) ×1 IMPLANT
NDL I-PASS III (NEEDLE) IMPLANT
NDL SPNL 18GX3.5 QUINCKE PK (NEEDLE) ×1 IMPLANT
NEEDLE 22X1 1/2 (OR ONLY) (NEEDLE) ×2 IMPLANT
NEEDLE I-PASS III (NEEDLE) ×2 IMPLANT
NEEDLE SPNL 18GX3.5 QUINCKE PK (NEEDLE) ×2 IMPLANT
NS IRRIG 1000ML POUR BTL (IV SOLUTION) ×2 IMPLANT
PACK LAMINECTOMY ORTHO (CUSTOM PROCEDURE TRAY) ×2 IMPLANT
PACK UNIVERSAL I (CUSTOM PROCEDURE TRAY) ×2 IMPLANT
PAD ARMBOARD 7.5X6 YLW CONV (MISCELLANEOUS) ×4 IMPLANT
PATTIES SURGICAL .5 X.5 (GAUZE/BANDAGES/DRESSINGS) IMPLANT
PATTIES SURGICAL .5 X1 (DISPOSABLE) ×2 IMPLANT
POSITIONER HEAD PRONE TRACH (MISCELLANEOUS) ×2 IMPLANT
PROBE BALL TIP NVM5 SNG USE (BALLOONS) ×1 IMPLANT
ROD RELINE MAS LORD 5.5X100MM (Rod) ×2 IMPLANT
ROD RELINE MAS LORD 5.5X120MM (Rod) ×1 IMPLANT
SCREW LOCK RELINE 5.5 TULIP (Screw) ×8 IMPLANT
SCREW RELINE MAS POLY 6.5X40MM (Screw) ×2 IMPLANT
SCREW RELINE RED 6.5X45MM POLY (Screw) ×2 IMPLANT
SCREW SPINAL REDUCE 5.5X40 C2 (Screw) ×4 IMPLANT
SPONGE LAP 4X18 RFD (DISPOSABLE) ×4 IMPLANT
SPONGE SURGIFOAM ABS GEL 100 (HEMOSTASIS) ×2 IMPLANT
STAPLER VISISTAT 35W (STAPLE) ×1 IMPLANT
SURGIFLO W/THROMBIN 8M KIT (HEMOSTASIS) ×1 IMPLANT
SUT BONE WAX W31G (SUTURE) ×2 IMPLANT
SUT MNCRL AB 3-0 PS2 18 (SUTURE) ×3 IMPLANT
SUT VIC AB 1 CT1 18XCR BRD 8 (SUTURE) ×1 IMPLANT
SUT VIC AB 1 CT1 27 (SUTURE) ×2
SUT VIC AB 1 CT1 27XBRD ANBCTR (SUTURE) IMPLANT
SUT VIC AB 1 CT1 8-18 (SUTURE) ×4
SUT VIC AB 1 CTX 36 (SUTURE)
SUT VIC AB 1 CTX36XBRD ANBCTR (SUTURE) IMPLANT
SUT VIC AB 2-0 CT1 18 (SUTURE) ×4 IMPLANT
SYR BULB IRRIGATION 50ML (SYRINGE) ×2 IMPLANT
SYR CONTROL 10ML LL (SYRINGE) ×2 IMPLANT
TOWEL GREEN STERILE (TOWEL DISPOSABLE) ×2 IMPLANT
TOWEL GREEN STERILE FF (TOWEL DISPOSABLE) ×2 IMPLANT
TRAY FOLEY MTR SLVR 16FR STAT (SET/KITS/TRAYS/PACK) ×2 IMPLANT
WATER STERILE IRR 1000ML POUR (IV SOLUTION) ×2 IMPLANT
YANKAUER SUCT BULB TIP NO VENT (SUCTIONS) ×2 IMPLANT

## 2017-12-02 NOTE — Anesthesia Postprocedure Evaluation (Signed)
Anesthesia Post Note  Patient: Sydnie Begin  Procedure(s) Performed: Posterior spinal fusion interbody L2-5 (N/A )     Patient location during evaluation: PACU Anesthesia Type: General Level of consciousness: awake and alert Pain management: pain level controlled Vital Signs Assessment: post-procedure vital signs reviewed and stable Respiratory status: spontaneous breathing, nonlabored ventilation, respiratory function stable and patient connected to nasal cannula oxygen Cardiovascular status: blood pressure returned to baseline and stable Postop Assessment: no apparent nausea or vomiting Anesthetic complications: no    Last Vitals:  Vitals:   12/02/17 1321 12/02/17 1335  BP: (!) 106/59 100/61  Pulse: 79 70  Resp: 18 18  Temp: 36.9 C 37 C  SpO2: 95% 98%    Last Pain:  Vitals:   12/02/17 1335  TempSrc: Oral  PainSc:                  Shelton SilvasKevin D Kanden Carey

## 2017-12-02 NOTE — Progress Notes (Signed)
Patient ID: Allison Stevenson, female   DOB: 1960-06-26, 57 y.o.   MRN: [id number] Patient is an short stay in preparation for second stage of back surgery. Reports some back soreness.  Had some nausea this morning as well. Soft and nontender.  2+ dorsalis pedis pulses bilaterally Stable following.  Anterior approach. Plan per Dr. Shon BatonBrooks

## 2017-12-02 NOTE — Evaluation (Signed)
Occupational Therapy Evaluation Patient Details Name: Allison Stevenson MRN: [id number] DOB: 02/04/1961 Today's Date: 12/02/2017    History of Present Illness Pt is a 57 y/o female s/p L4-5 ALIF and L2-4 lateral interbody fusion. 8/15 Posterior spinal fusion interbody L2-5. PMH includes thoracic outlet.    Clinical Impression   PTA patient reports independent with ADLs, IADLs and mobility.  She currently requires moderate assist for transfers, min guard for bed mobility, moderate assistance for LB ADL, and setup assistance for UB ADL. Educated on precautions, safety, brace management and wearing schedule, and modified techniques/AE for ADLs.  Pt limited by decreased activity tolerance, pain, impaired balance, and precautions.  Based on performance today, recommend continued OT services while admitted and after discharge (HHOT) in order to maximize safety and independence.  Will continue to follow and updated recommendations as needed.     Follow Up Recommendations  Home health OT;Supervision/Assistance - 24 hour(vs No follow up dependent on progress)    Equipment Recommendations  None recommended by OT    Recommendations for Other Services       Precautions / Restrictions Precautions Precautions: Back Precaution Booklet Issued: Yes (comment) Precaution Comments: Reviewed back precautions Required Braces or Orthoses: Spinal Brace Spinal Brace: Lumbar corset;Applied in sitting position Restrictions Weight Bearing Restrictions: No      Mobility Bed Mobility Overal bed mobility: Needs Assistance Bed Mobility: Sit to Sidelying;Rolling Rolling: Min guard       Sit to sidelying: Min guard General bed mobility comments: log rolling with min guard for safety, increased time and effort but not phyiscal assistance   Transfers Overall transfer level: Needs assistance Equipment used: Rolling walker (2 wheeled) Transfers: Sit to/from Stand Sit to Stand: Mod assist         General  transfer comment: cueing for hand placement during transfers, assist to ascend into standing and controlled descend    Balance Overall balance assessment: Needs assistance Sitting-balance support: Feet supported;No upper extremity supported Sitting balance-Leahy Scale: Fair     Standing balance support: Bilateral upper extremity supported;During functional activity Standing balance-Leahy Scale: Poor Standing balance comment: reliant on B UE support in standing                            ADL either performed or assessed with clinical judgement   ADL Overall ADL's : Needs assistance/impaired Eating/Feeding: Supervision/ safety;Sitting   Grooming: Supervision/safety;Sitting   Upper Body Bathing: Set up;Sitting   Lower Body Bathing: Moderate assistance;Sitting/lateral leans;Cueing for back precautions Lower Body Bathing Details (indicate cue type and reason): unable to complete figure 4 technique, educated on long sponge and adherance to precautions seated Upper Body Dressing : Supervision/safety;Sitting;Set up   Lower Body Dressing: Moderate assistance;Sit to/from stand;Cueing for back precautions;Cueing for compensatory techniques Lower Body Dressing Details (indicate cue type and reason): unable to complete figure 4 technique seated, educated on AE but will benefit from training; min A once standing   Toilet Transfer: Moderate assistance;Ambulation;RW(simulated in room) Toilet Transfer Details (indicate cue type and reason): physical assistance to ascend with cueing for hand placement and back precautions  Toileting- Clothing Manipulation and Hygiene: Moderate assistance;Sit to/from stand       Functional mobility during ADLs: Rolling walker;Minimal assistance;Moderate assistance General ADL Comments: patient requires increased time and effort for all activities, completed bed mobility, transfers and ADLs      Vision   Vision Assessment?: No apparent visual deficits      Perception  Praxis      Pertinent Vitals/Pain Pain Assessment: Faces Faces Pain Scale: Hurts little more Pain Location: back  Pain Descriptors / Indicators: Grimacing;Guarding Pain Intervention(s): Limited activity within patient's tolerance;Monitored during session     Hand Dominance     Extremity/Trunk Assessment Upper Extremity Assessment Upper Extremity Assessment: Overall WFL for tasks assessed   Lower Extremity Assessment Lower Extremity Assessment: Defer to PT evaluation   Cervical / Trunk Assessment Cervical / Trunk Assessment: Other exceptions Cervical / Trunk Exceptions: s/p lumbar surgery   Communication Communication Communication: No difficulties   Cognition Arousal/Alertness: Awake/alert Behavior During Therapy: WFL for tasks assessed/performed Overall Cognitive Status: Within Functional Limits for tasks assessed                                     General Comments  family present during session, reports generalized weakness during session     Exercises     Shoulder Instructions      Home Living Family/patient expects to be discharged to:: Private residence Living Arrangements: Parent;Children Available Help at Discharge: Family Type of Home: House Home Access: Stairs to enter Entergy CorporationEntrance Stairs-Number of Steps: 4 Entrance Stairs-Rails: Left Home Layout: Two level Alternate Level Stairs-Number of Steps: flight    Bathroom Shower/Tub: Producer, television/film/videoWalk-in shower   Bathroom Toilet: Standard Bathroom Accessibility: Yes   Home Equipment: Environmental consultantWalker - 2 wheels;Bedside commode;Shower seat   Additional Comments: patient reports has half bath downstairs, but shower and bed room upstairs       Prior Functioning/Environment Level of Independence: Independent        Comments: independent ADLs, IADls and mobility         OT Problem List: Decreased strength;Decreased activity tolerance;Impaired balance (sitting and/or standing);Decreased  safety awareness;Decreased knowledge of use of DME or AE;Decreased knowledge of precautions;Pain      OT Treatment/Interventions: Self-care/ADL training;Therapeutic exercise;Energy conservation;DME and/or AE instruction;Therapeutic activities;Patient/family education;Balance training    OT Goals(Current goals can be found in the care plan section) Acute Rehab OT Goals Patient Stated Goal: to get home and feel better OT Goal Formulation: With patient Time For Goal Achievement: 12/16/17 Potential to Achieve Goals: Good  OT Frequency: Min 2X/week   Barriers to D/C:            Co-evaluation              AM-PAC PT "6 Clicks" Daily Activity     Outcome Measure Help from another person eating meals?: None Help from another person taking care of personal grooming?: A Little Help from another person toileting, which includes using toliet, bedpan, or urinal?: A Lot Help from another person bathing (including washing, rinsing, drying)?: A Lot Help from another person to put on and taking off regular upper body clothing?: None Help from another person to put on and taking off regular lower body clothing?: A Lot 6 Click Score: 17   End of Session Equipment Utilized During Treatment: Gait belt;Rolling walker;Back brace Nurse Communication: Mobility status  Activity Tolerance: Patient tolerated treatment well Patient left: Other (comment)(with PT)  OT Visit Diagnosis: Other abnormalities of gait and mobility (R26.89);Muscle weakness (generalized) (M62.81);Pain Pain - Right/Left: Left Pain - part of body: (back)                Time: 1634-1700 OT Time Calculation (min): 26 min Charges:  OT General Charges $OT Visit: 1 Visit OT Evaluation $OT Eval Moderate Complexity: 1  Mod OT Treatments $Self Care/Home Management : 8-22 mins  Chancy Milroyhristie S Bosten Newstrom, OTR/L  Pager [phone]   Chancy MilroyChristie S Maryanne Huneycutt 12/02/2017, 5:28 PM

## 2017-12-02 NOTE — Progress Notes (Signed)
OT Cancellation Note  Patient Details Name: Allison Stevenson MRN: [id number] DOB: 12/05/1960   Cancelled Treatment:    Reason Eval/Treat Not Completed: Patient at procedure or test/ unavailable(Pt in surgery. Will follow.)  Evern BioMayberry, [name] 12/02/2017, 8:28 AM

## 2017-12-02 NOTE — Progress Notes (Signed)
Physical Therapy Treatment Patient Details Name: Allison Stevenson MRN: [id number] DOB: 1960/05/31 Today's Date: 12/02/2017    History of Present Illness Pt is a 57 y/o female s/p L4-5 ALIF and L2-4 lateral interbody fusion. 8/15 Posterior spinal fusion interbody L2-5. PMH includes thoracic outlet.     PT Comments    Patient with improvement today as compared to yesterday. Able to ambulate 25 feet using walker and min guard assist, however, does require moderate assistance to transfer into standing. Displays good posture throughout. Somewhat limited by dizziness this session. Will continue to progress mobility. Recommending HHPT at discharge.    Follow Up Recommendations  Home health PT;Supervision for mobility/OOB     Equipment Recommendations  None recommended by PT    Recommendations for Other Services       Precautions / Restrictions Precautions Precautions: Back Precaution Booklet Issued: Yes (comment) Precaution Comments: Reviewed back precautions Required Braces or Orthoses: Spinal Brace Spinal Brace: Lumbar corset;Applied in sitting position Restrictions Weight Bearing Restrictions: No    Mobility  Bed Mobility Overal bed mobility: Needs Assistance Bed Mobility: Sit to Sidelying;Rolling Rolling: Min guard       Sit to sidelying: Min guard General bed mobility comments: Seated EOB with OT on entry  Transfers Overall transfer level: Needs assistance Equipment used: Rolling walker (2 wheeled) Transfers: Sit to/from Stand Sit to Stand: Mod assist         General transfer comment: cueing for hand placement during transfers, assist to ascend into standing and controlled descend  Ambulation/Gait Ambulation/Gait assistance: Min guard Gait Distance (Feet): 25 Feet Assistive device: Rolling walker (2 wheeled) Gait Pattern/deviations: Step-to pattern;Narrow base of support Gait velocity: decr Gait velocity interpretation: <1.31 ft/sec, indicative of household  ambulator General Gait Details: Patient with overall good posture, utilizing a cautious gait pattern with step to pattern. Cues provided for sequencing. Moderate reliance on walker for support   Stairs             Wheelchair Mobility    Modified Rankin (Stroke Patients Only)       Balance Overall balance assessment: Needs assistance Sitting-balance support: Feet supported;No upper extremity supported Sitting balance-Leahy Scale: Fair     Standing balance support: Bilateral upper extremity supported;During functional activity Standing balance-Leahy Scale: Poor Standing balance comment: reliant on B UE support in standing. When relied on single UE support, experiencing posterior LOB requiring min assist by PT to correct                            Cognition Arousal/Alertness: Awake/alert Behavior During Therapy: WFL for tasks assessed/performed Overall Cognitive Status: Within Functional Limits for tasks assessed                                        Exercises      General Comments General comments (skin integrity, edema, etc.): family present during session      Pertinent Vitals/Pain Pain Assessment: Faces Faces Pain Scale: Hurts little more Pain Location: back  Pain Descriptors / Indicators: Grimacing;Guarding Pain Intervention(s): Monitored during session    Home Living Family/patient expects to be discharged to:: Private residence Living Arrangements: Parent;Children Available Help at Discharge: Family Type of Home: House Home Access: Stairs to enter Entrance Stairs-Rails: Left Home Layout: Two level Home Equipment: Environmental consultantWalker - 2 wheels;Bedside commode;Shower seat Additional Comments: patient reports has half bath  downstairs, but shower and bed room upstairs     Prior Function Level of Independence: Independent      Comments: independent ADLs, IADls and mobility    PT Goals (current goals can now be found in the care plan  section) Acute Rehab PT Goals Patient Stated Goal: to get home and feel better Potential to Achieve Goals: Good Progress towards PT goals: Progressing toward goals    Frequency    Min 5X/week      PT Plan      Co-evaluation              AM-PAC PT "6 Clicks" Daily Activity  Outcome Measure  Difficulty turning over in bed (including adjusting bedclothes, sheets and blankets)?: A Little Difficulty moving from lying on back to sitting on the side of the bed? : A Lot Difficulty sitting down on and standing up from a chair with arms (e.g., wheelchair, bedside commode, etc,.)?: Unable Help needed moving to and from a bed to chair (including a wheelchair)?: A Little Help needed walking in hospital room?: A Little Help needed climbing 3-5 steps with a railing? : A Lot 6 Click Score: 14    End of Session Equipment Utilized During Treatment: Gait belt Activity Tolerance: Patient limited by lethargy Patient left: Other (comment);with call bell/phone within reach;with family/visitor present(seated EOB) Nurse Communication: Mobility status PT Visit Diagnosis: Unsteadiness on feet (R26.81);Other abnormalities of gait and mobility (R26.89);Muscle weakness (generalized) (M62.81)     Time: [id number] PT Time Calculation (min) (ACUTE ONLY): 14 min  Charges:  $Gait Training: 8-22 mins                    Laurina Bustlearoline Verneda Hollopeter, PT, DPT Acute Rehabilitation Services  Pager: [phone]    Vanetta MuldersCarloine H Adalberto Metzgar 12/02/2017, 5:36 PM

## 2017-12-02 NOTE — Op Note (Addendum)
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  Postoperative diagnosis: Same  Operative procedure: Posterior instrumented fusion L2-5.  Complications: None  Nuvasive: MIS pedicle screws: L2 and 3: 5.385mm x 40mm L4: 6.5 mm x 45 mm length, L5: 6.5 mm x 40 mm length.   First assistant: Anette Riedelarmen Mayo, PA  Intraoperative neuro monitoring: Episodes of significant interference causing abnormal readings.  However this was addressed and there were no true abnormal EMG or SSEP findings.  All 8 pedicle screws were directly stimulated and there was no adverse activity at greater than 40 mA.  Indications: [name] returns to the operating room today for a planned second stage lumbar fusion procedure.  The day prior she had the anterior lumbar interbody fusion at L4-5, and lateral interbody fusions at L2-3, and L3-4.  Patient returns today for planned posterior supplemental fixation from L2-L5.  All appropriate risks benefits and alternatives were discussed with the patient and her husband prior to surgery and consent was obtained.  Operative report: Patient was brought the operating room placed by the operating room table.  After successful induction of general anesthesia and endotracheal the patient teds SCDs were applied.  Patient already had a Foley in place.  The patient [facility] after all appropriate neuro monitoring needles were placed by the representative.  The arms were carefully positioned overhead and all bony prominences were properly padded. The back was then prepped and draped in a standard fashion.  Timeout was taken to confirm patient procedure and all other important data.  Intraoperative fluoroscopy was then used to identify the lateral borders of the L2, 3, 4, and 5 pedicles.  I marked each of these out and then infiltrated the area with quarter percent  Marcaine with epinephrine.  A small call incision was made at the L2 level and the Jamshidi needle was passed percutaneously down to the lateral aspect of the pedicle.  I confirmed satisfactory positioning in the AP plane.  The Jamshidi needle was then directly connected to the stimulating device and using fluoroscopic guidance and the neuro monitoring I advanced the Jamshidi needle into the pedicle of L2.  I confirmed satisfactory position in the pedicle itself with fluoroscopy.  As I near the medial wall of the pedicle I changed my view to the lateral.  I confirmed that I was just beyond the posterior margin of the vertebral body indicating had a satisfactory and safe trajectory.  In addition there was no adverse free running EMG activity to suggest pedicle breach.  I advanced the Jamshidi needle into the vertebral body of L2.  I then placed the guidepin to cannulate the pedicle.  I remove the Jamshidi needle and then repeated this exact same procedure at L3, L4, and L5.  Once all 4 pedicles were cannulated on the right-hand side I then went to the left-hand side to cannulate those pedicles.  I again marked out the occasions of the pedicle and infiltrated the area with quarter percent Marcaine.  I then made vertical incisions and using the same technique cannulated all of the 4 pedicles.  At this point all 8 pedicles were cannulated.  I then measured and then placed the appropriate size pedicle screws at each level.  At L2 and L3 I used a 5.5 diameter 40 mm length screw.  At L4 I placed a 6.5 diameter 45 mm length screw, and at L5 I placed a 6.5 diameter  40 mm length screw.  All screws had excellent purchase and were securely fixed in place.  I then directly stimulated each of the screws and there was no adverse EMG activity at greater than 40 mA.  I then measured the construct and obtain the appropriate size rod.  The rod was passed and I confirmed that it was properly positioned and then placed the locking  caps.  Once all the locking caps were placed I then torqued them off according manufacturer standards.  Once both rods were secured in place I took final x-rays.  There was significant improvement in the patient's preoperative coronal and sagittal imbalance.  The lateral listhesis and scoliosis was improved.  At this point all wounds were copiously irrigated with an normal saline and hemostasis was obtained.  I then closed the incision site in a layered fashion with interrupted #1 Vicryl sutures for the deep layer and a layer 2-0 Vicryl suture and 3-0 Monocryl.  Steri-Strips and dry dressings were applied and the patient was ultimately extubated transfer the PACU without incident.  The end of the case all needle and sponge counts were correct.  There are no adverse events during the surgery.

## 2017-12-02 NOTE — Anesthesia Preprocedure Evaluation (Addendum)
Anesthesia Evaluation  Patient identified by MRN, date of birth, ID band Patient awake    Reviewed: Allergy & Precautions, NPO status , Patient's Chart, lab work & pertinent test results  Airway Mallampati: II  TM Distance: >3 FB Neck ROM: Full    Dental  (+) Teeth Intact, Dental Advisory Given   Pulmonary neg pulmonary ROS,    breath sounds clear to auscultation       Cardiovascular negative cardio ROS   Rhythm:Regular Rate:Normal     Neuro/Psych  Headaches,    GI/Hepatic negative GI ROS, Neg liver ROS,   Endo/Other  negative endocrine ROS  Renal/GU negative Renal ROS     Musculoskeletal  (+) Arthritis ,   Abdominal Normal abdominal exam  (+)   Peds  Hematology   Anesthesia Other Findings Always wakes up with weak hands  Reproductive/Obstetrics                            Lab Results  Component Value Date   WBC 4.7 11/22/2017   HGB 12.5 11/22/2017   HCT 38.3 11/22/2017   MCV 94.8 11/22/2017   PLT 215 11/22/2017   Lab Results  Component Value Date   CREATININE 0.64 11/22/2017   BUN 12 11/22/2017   NA 140 11/22/2017   K 3.8 11/22/2017   CL 105 11/22/2017   CO2 27 11/22/2017   No results found for: INR, PROTIME   Anesthesia Physical Anesthesia Plan  ASA: II  Anesthesia Plan: General   Post-op Pain Management:    Induction: Intravenous  PONV Risk Score and Plan: 4 or greater and Ondansetron, Dexamethasone, Midazolam and Scopolamine patch - Pre-op  Airway Management Planned: Oral ETT  Additional Equipment: None  Intra-op Plan:   Post-operative Plan: Extubation in OR  Informed Consent: I have reviewed the patients History and Physical, chart, labs and discussed the procedure including the risks, benefits and alternatives for the proposed anesthesia with the patient or authorized representative who has indicated his/her understanding and acceptance.   Dental advisory  given  Plan Discussed with: CRNA  Anesthesia Plan Comments:        Anesthesia Quick Evaluation

## 2017-12-02 NOTE — Brief Op Note (Signed)
12/01/2017 - 12/02/2017  10:44 AM  PATIENT:  Allison Stevenson  57 y.o. female  PRE-OPERATIVE DIAGNOSIS:  Degenerative scoliosis, lateral listhesis with degenerative disc disease  POST-OPERATIVE DIAGNOSIS:  Degenerative scoliosis, lateral listhesis with degenerative disc   PROCEDURE:  Procedure(s) with comments: Posterior spinal fusion interbody L2-5 (N/A) - Requests 6 hrs  SURGEON:  Surgeon(s) and Role:    Venita Lick* Doshia Dalia, MD - Primary  PHYSICIAN ASSISTANT:   ASSISTANTS: Carmen Mayo   ANESTHESIA:   general  EBL:  100 mL   BLOOD ADMINISTERED:none  DRAINS: none   LOCAL MEDICATIONS USED:  MARCAINE     SPECIMEN:  No Specimen  DISPOSITION OF SPECIMEN:  N/A  COUNTS:  YES  TOURNIQUET:  * No tourniquets in log *  DICTATION: .Dragon Dictation  PLAN OF CARE: Admit to inpatient   PATIENT DISPOSITION:  PACU - hemodynamically stable.

## 2017-12-02 NOTE — Anesthesia Procedure Notes (Signed)
Procedure Name: Intubation Date/Time: 12/02/2017 7:44 AM Performed by: Fransisca KaufmannMeyer, Mandy Fitzwater E, CRNA Pre-anesthesia Checklist: Patient identified, Emergency Drugs available, Suction available and Patient being monitored Patient Re-evaluated:Patient Re-evaluated prior to induction Oxygen Delivery Method: Circle System Utilized Preoxygenation: Pre-oxygenation with 100% oxygen Induction Type: IV induction Ventilation: Mask ventilation without difficulty Laryngoscope Size: Miller and 2 Grade View: Grade I Tube type: Oral Tube size: 7.0 mm Number of attempts: 1 Airway Equipment and Method: Stylet and Oral airway Placement Confirmation: ETT inserted through vocal cords under direct vision,  positive ETCO2 and breath sounds checked- equal and bilateral Secured at: 21 cm Tube secured with: Tape Dental Injury: Teeth and Oropharynx as per pre-operative assessment

## 2017-12-02 NOTE — Plan of Care (Signed)
  Problem: Safety: Goal: Ability to remain free from injury will improve Outcome: Progressing   

## 2017-12-02 NOTE — Discharge Instructions (Signed)
Spinal Fusion, Care After °These instructions give you information about caring for yourself after your procedure. Your doctor may also give you more specific instructions. Call your doctor if you have any problems or questions after your procedure. °Follow these instructions at home: °Medicines °· Take over-the-counter and prescription medicines only as told by your doctor. These include any medicines for pain. °· Do not drive for 24 hours if you received a sedative. °· Do not drive or use heavy machinery while taking prescription pain medicine. °· If you were prescribed an antibiotic medicine, take it as told by your doctor. Do not stop taking the antibiotic even if you start to feel better. °Surgical Cut (Incision) Care °· Follow instructions from your doctor about how to take care of your surgical cut. Make sure you: °? Wash your hands with soap and water before you change your bandage (dressing). If you cannot use soap and water, use hand sanitizer. °? Change your bandage as told by your doctor. °? Leave stitches (sutures), skin glue, or skin tape (adhesive) strips in place. They may need to stay in place for 2 weeks or longer. If tape strips get loose and curl up, you may trim the loose edges. Do not remove tape strips completely unless your doctor says it is okay. °· Keep your surgical cut clean and dry. Do not take baths, swim, or use a hot tub until your doctor says it is okay. °· Check your surgical cut and the area around it every day for: °? Redness. °? Swelling. °? Fluid. °Physical Activity °· Return to your normal activities as told by your doctor. Ask your doctor what activities are safe for you. Rest and protect your back as much as you can. °· Follow instructions from your doctor about how to move. Use good posture to help your spine heal. °· Do not lift anything that is heavier than 8 lb (3.6 kg) or as told by your doctor until he or she says that it is safe. Do not lift anything over your  head. °· Do not twist or bend at the waist until your doctor says it is okay. °· Avoid pushing or pulling motions. °· Do not sit or lie down in the same position for long periods of time. °· Do not start to exercise until your doctor says it is okay. Ask your doctor what kinds of exercise you can do to make your back stronger. °General instructions °· If you were given a brace, use it as told by your doctor. °· Wear compression stockings as told by your doctor. °· Do not use tobacco products. These include cigarettes, chewing tobacco, or e-cigarettes. If you need help quitting, ask your doctor. °· Keep all follow-up visits as told by your doctor. This is important. This includes any visits with your physical therapist, if this applies. °Contact a doctor if: °· Your pain gets worse. °· Your medicine does not help your pain. °· Your legs or feet become painful or swollen. °· Your surgical cut is red, swollen, or painful. °· You have fluid, blood, or pus coming from your surgical cut. °· You feel sick to your stomach (nauseous). °· You throw up (vomit). °· Your have weakness or loss of feeling (numbness) in your legs that is new or getting worse. °· You have a fever. °· You have trouble controlling when you pee (urinate) or poop (have a bowel movement). °Get help right away if: °· Your pain is very bad. °· You have   chest pain. °· You have trouble breathing. °· You start to have a cough. °These symptoms may be an emergency. Do not wait to see if the symptoms will go away. Get medical help right away. Call your local emergency services (911 in the [place] Do not drive yourself to the hospital. °This information is not intended to replace advice given to you by your health care provider. Make sure you discuss any questions you have with your health care provider. °Document Released: 07/31/2010 Document Revised: 12/03/2015 Document Reviewed: 09/19/2014 °Elsevier Interactive Patient Education © 2018 [employer] ° °

## 2017-12-02 NOTE — Transfer of Care (Signed)
Immediate Anesthesia Transfer of Care Note  Patient: Allison Stevenson  Procedure(s) Performed: Posterior spinal fusion interbody L2-5 (N/A )  Patient Location: PACU  Anesthesia Type:General  Level of Consciousness: awake, alert , oriented and sedated  Airway & Oxygen Therapy: Patient Spontanous Breathing and Patient connected to nasal cannula oxygen  Post-op Assessment: Report given to RN, Post -op Vital signs reviewed and stable and Patient moving all extremities  Post vital signs: Reviewed and stable  Last Vitals:  Vitals Value Taken Time  BP 106/67 12/02/2017 11:44 AM  Temp    Pulse 90 12/02/2017 11:50 AM  Resp 15 12/02/2017 11:50 AM  SpO2 100 % 12/02/2017 11:50 AM  Vitals shown include unvalidated device data.  Last Pain:  Vitals:   12/02/17 0511  TempSrc:   PainSc: Asleep      Patients Stated Pain Goal: 0 (12/02/17 0441)  Complications: No apparent anesthesia complications

## 2017-12-02 NOTE — Progress Notes (Signed)
    Subjective: Procedure(s) (LRB): Posterior spinal fusion interbody L2-5 (N/A) Day of Surgery  Patient reports pain as 6 on 0-10 scale.  Reports decreased leg pain reports incisional back pain   N/A void - foley still in place Negative bowel movement Positive flatus Negative chest pain or shortness of breath  Objective: Vital signs in last 24 hours: Temp:  [96.8 F (36 C)-99.5 F (37.5 C)] 99.5 F (37.5 C) (08/15 0426) Pulse Rate:  [62-96] 89 (08/15 0426) Resp:  [10-20] 18 (08/15 0426) BP: (93-124)/(51-84) 104/51 (08/15 0426) SpO2:  [97 %-100 %] 99 % (08/15 0426)  Intake/Output from previous day: 08/14 0701 - 08/15 0700 In: 2403 [I.V.:1753] Out: 1180 [Urine:1100; Blood:80]  Labs: No results for input(s): WBC, RBC, HCT, PLT in the last 72 hours. No results for input(s): NA, K, CL, CO2, BUN, CREATININE, GLUCOSE, CALCIUM in the last 72 hours. No results for input(s): LABPT, INR in the last 72 hours.  Physical Exam: ABD soft Intact pulses distally Incision: dressing C/D/I Compartment soft Patient complains of left thigh/groin pain.  Neuro: Intact sensation light touch throughout in both the upper and lower extremities.             5 out of 5 motor strength in the upper extremity bilaterally.  Patient has generalized weakness in the lower extremity but does note slight increased weakness in the left hip flexor and quad. Body mass index is 19.66 kg/m.   Assessment/Plan: Patient stable  xrays n/a Patient has weakness and pain in the left groin and thigh most likely secondary to the trans-psoas approach.  During the case there was no abnormal free running EMGs to suggest direct trauma or traction injury to the lumbar plexus but there may be some inflammation and swelling which is resulting in the weakness that is seen.  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  At this point time we will plan on moving  forward with the posterior supplementation L2-5.  I have again gone over the risks and benefits of surgery with the patient which include infection, bleeding, nerve damage, ongoing or worse pain, need for additional surgery, hardware complications, nonunion, adjacent segment disease, death, stroke, paralysis.  Venita Lickahari Batya Citron, MD [facility] [phone]

## 2017-12-03 MED ORDER — GABAPENTIN 300 MG PO CAPS
300.0000 mg | ORAL_CAPSULE | Freq: Three times a day (TID) | ORAL | Status: DC
  Administered 2017-12-03 – 2017-12-04 (×5): 300 mg via ORAL
  Filled 2017-12-03 (×6): qty 1

## 2017-12-03 MED ORDER — ENOXAPARIN SODIUM 40 MG/0.4ML ~~LOC~~ SOLN
40.0000 mg | SUBCUTANEOUS | Status: DC
  Administered 2017-12-04: 40 mg via SUBCUTANEOUS
  Filled 2017-12-03: qty 0.4

## 2017-12-03 MED FILL — Thrombin (Recombinant) For Soln 20000 Unit: CUTANEOUS | Qty: 1 | Status: AC

## 2017-12-03 NOTE — Progress Notes (Signed)
1915 Bedside shift report. Pt sleeping, easy to arouse. Fall [facility] in [facility]   2100 Pt still sleeping soundly, easy to arouse. Pt assessed, see flowsheet, assisted pt to bathroom and ambulated around the halls. Unsteady gait noted, pt using walker, wearing back brace. Pt assisted back to recliner. Husband at bedside. Pt denies pain while sitting. NAD, WCTM.  2200 Pt watching TV with husband, NAD.   2300 Report given to [facility], RN.

## 2017-12-03 NOTE — Progress Notes (Addendum)
    Subjective: 1 Day Post-Op Procedure(s) (LRB): Posterior spinal fusion interbody L2-5 (N/A) Patient reports pain as 6 on 0-10 scale.   Denies CP or SOB.  Voiding without difficulty. Positive flatus.Pt has been walking with PT and has participated in stair climbing. Pt has had tremors while laying down in the bed.  Decreased when sitting in chair.  Seems less today than yesterday. Pt BP is improving.  Objective: Vital signs in last 24 hours: Temp:  [98.3 F (36.8 C)-99.4 F (37.4 C)] 98.8 F (37.1 C) (08/16 1215) Pulse Rate:  [70-103] 103 (08/16 1215) Resp:  [16-20] 16 (08/16 1215) BP: (84-106)/(43-61) 100/60 (08/16 1215) SpO2:  [95 %-100 %] 99 % (08/16 1215)  Intake/Output from previous day: 08/15 0701 - 08/16 0700 In: 2553.8 [P.O.:150; I.V.:2103.8; IV Piggyback:300] Out: 900 [Urine:700; Blood:200] Intake/Output this shift: No intake/output data recorded.  Labs: No results for input(s): HGB in the last 72 hours. No results for input(s): WBC, RBC, HCT, PLT in the last 72 hours. No results for input(s): NA, K, CL, CO2, BUN, CREATININE, GLUCOSE, CALCIUM in the last 72 hours. No results for input(s): LABPT, INR in the last 72 hours.  Physical Exam: Neurologically intact ABD soft Sensation intact distally Dorsiflexion/Plantar flexion intact Incision: scant drainage Compartment soft Body mass index is 19.66 kg/m.   Assessment/Plan: 1 Day Post-Op Procedure(s) (LRB): Posterior spinal fusion interbody L2-5 (N/A) Advance diet Up with therapy Continue to drink and stay hydrated Consider DC Saturday vs Sunday Home Heath Pt has been recommended and ordered  Kirt BoysMayo, Carmen Christina for Dr. Venita Lickahari Ardeth Repetto [facility] [phone] 12/03/2017, 1:20 PM  Patient doing well overall - neuro exam improving Ambulating with assistance If cleared by PT for home d/c to home tomorrow Plan on transfer to 4N this evening as unit ma be closing

## 2017-12-03 NOTE — Care Management Note (Signed)
Case Management Note  Patient Details  Name: Allison Stevenson MRN: [id number] Date of Birth: 1961-01-20  Subjective/Objective:    57 yr old female s/p L2-5 posterior spinal fusion.                Action/Plan: Case manager spoke with patient concerning discharge plan. She is under worker's comp. CM left a message for her AdjusterGlori Bickers- Beth Gregory [phone], faxed orders to her at [phone]. Patient's MVHQ#[id number]#[id number]. Patient will be contacted when Home health is arranged.   Expected Discharge Date:  12/03/17.              Expected Discharge Plan:  Home w Home Health Services  In-House Referral:  NA  Discharge planning Services  CM Consult  Post Acute Care Choice:  Home Health Choice offered to:  (worker's comp to arrange Home Health Agency)  DME Arranged:  N/A DME Agency:  NA  HH Arranged:  PT, OT HH Agency:  (to be arranged by [employer]'s comp)  Status of Service:  Completed, signed off  If discussed at [employer] Meetings, dates discussed:    Additional Comments:  Durenda GuthrieBrady, Jamarian Jacinto Naomi, RN 12/03/2017, 1:54 PM

## 2017-12-03 NOTE — Progress Notes (Signed)
Physical Therapy Treatment Patient Details Name: Allison Stevenson MRN: [id number] DOB: February 26, 1961 Today's Date: 12/03/2017    History of Present Illness Pt is a 57 y/o female s/p L4-5 ALIF and L2-4 lateral interbody fusion. 8/15 Posterior spinal fusion interbody L2-5. PMH includes thoracic outlet.     PT Comments    Patient seen for additional session to focus on stair training. Patient negotiating 4 steps with left railing sideways for bilateral hand support to simulate home environment. Requiring min assist for ascending/descending steps. Patient husband present and verbalized understanding with technique. Lumbar corset adjusted for comfort/support.    Follow Up Recommendations  Home health PT;Supervision for mobility/OOB     Equipment Recommendations  None recommended by PT    Recommendations for Other Services       Precautions / Restrictions Precautions Precautions: Back Precaution Booklet Issued: (issued in prior session ) Precaution Comments: Reviewed back precautions(pt able to recall 3/3 precautions ) Required Braces or Orthoses: Spinal Brace Spinal Brace: Lumbar corset;Applied in sitting position Restrictions Weight Bearing Restrictions: No    Mobility  Bed Mobility Overal bed mobility: Needs Assistance Bed Mobility: Sit to Sidelying;Rolling Rolling: Min assist       Sit to sidelying: Supervision General bed mobility comments: Received sitting in recliner  Transfers Overall transfer level: Needs assistance Equipment used: Rolling walker (2 wheeled) Transfers: Sit to/from Stand Sit to Stand: Mod assist         General transfer comment: Increased time to progress to edge of seat and moderate assistance to boost up to standing  Ambulation/Gait Ambulation/Gait assistance: Min guard Gait Distance (Feet): 30 Feet Assistive device: Rolling walker (2 wheeled) Gait Pattern/deviations: Step-to pattern;Step-through pattern;Narrow base of support;Decreased  dorsiflexion - right;Decreased dorsiflexion - left Gait velocity: decr Gait velocity interpretation: <1.31 ft/sec, indicative of household ambulator General Gait Details: Session focused on stair training rather than ambulation distance.    Stairs Stairs: Yes Stairs assistance: Min assist Stair Management: One rail Left;Sideways Number of Stairs: 4 General stair comments: Patient performing sideways technique with bilateral hands for support on railing. Cues for sequencing and leaving adequate space for next foot to be placed on the step. Also instructed pt to look up as she had her head in a flexed position. Min assist provided to steady   Wheelchair Mobility    Modified Rankin (Stroke Patients Only)       Balance Overall balance assessment: Needs assistance Sitting-balance support: Feet supported;No upper extremity supported Sitting balance-Leahy Scale: Fair Sitting balance - Comments: reliant on UE support, lateral lean with 0 hand support requiring min guard at times   Standing balance support: Bilateral upper extremity supported;During functional activity Standing balance-Leahy Scale: Poor Standing balance comment: reliant on B UE support                             Cognition Arousal/Alertness: Awake/alert Behavior During Therapy: WFL for tasks assessed/performed Overall Cognitive Status: Within Functional Limits for tasks assessed                                 General Comments: patient initally lethargic, but improves during session; reports "feeling weak"       Exercises General Exercises - Lower Extremity Long Arc Quad: 5 reps;Both;Seated    General Comments General comments (skin integrity, edema, etc.): spouse present; patient limited by "generalized weakness" requiring increased time for all tasks  Pertinent Vitals/Pain Pain Assessment: Faces Faces Pain Scale: Hurts little more Pain Location: back/groin Pain Descriptors /  Indicators: Grimacing;Guarding Pain Intervention(s): Monitored during session    Home Living                      Prior Function            PT Goals (current goals can now be found in the care plan section) Acute Rehab PT Goals Patient Stated Goal: to get home and feel better Potential to Achieve Goals: Good Progress towards PT goals: Progressing toward goals    Frequency    Min 5X/week      PT Plan Current plan remains appropriate    Co-evaluation              AM-PAC PT "6 Clicks" Daily Activity  Outcome Measure  Difficulty turning over in bed (including adjusting bedclothes, sheets and blankets)?: A Little Difficulty moving from lying on back to sitting on the side of the bed? : A Lot Difficulty sitting down on and standing up from a chair with arms (e.g., wheelchair, bedside commode, etc,.)?: Unable Help needed moving to and from a bed to chair (including a wheelchair)?: A Little Help needed walking in hospital room?: A Little Help needed climbing 3-5 steps with a railing? : A Lot 6 Click Score: 14    End of Session Equipment Utilized During Treatment: Gait belt Activity Tolerance: Patient tolerated treatment well Patient left: in chair;with call bell/phone within reach;with family/visitor present Nurse Communication: Mobility status PT Visit Diagnosis: Unsteadiness on feet (R26.81);Other abnormalities of gait and mobility (R26.89);Muscle weakness (generalized) (M62.81)     Time: [id number] PT Time Calculation (min) (ACUTE ONLY): 17 min  Charges:  $Gait Training: 8-22 mins $Therapeutic Activity: 8-22 mins          Allison Stevenson, PT, DPT Acute Rehabilitation Services  Pager: [phone]    Allison Stevenson 12/03/2017, 9:40 AM

## 2017-12-03 NOTE — Progress Notes (Signed)
Patient is transferred from room 3C10 to unit 4NP11 at this time. Alert and in stable condition. Report given to receiving nurse Efraim KaufmannMelissa, RN with all questions answered. Left unit via geri chair with family and all belongings at side.

## 2017-12-03 NOTE — Progress Notes (Signed)
Physical Therapy Treatment Patient Details Name: Allison Stevenson MRN: [id number] DOB: 1961-04-02 Today's Date: 12/03/2017    History of Present Illness Pt is a 57 y/o female s/p L4-5 ALIF and L2-4 lateral interbody fusion. 8/15 Posterior spinal fusion interbody L2-5. PMH includes thoracic outlet.     PT Comments    Session focused on progressing activity tolerance, gait training, and transfer training. Patient with increase in ambulation distance to 100 feet using walker and min guard assist but continues with slow speed and decreased foot clearance. Continues to require moderate assistance for transfers from sit to stand due to quad weakness. Instructed on techniques for transitioning and seated LAQ's for strengthening.     Follow Up Recommendations  Home health PT;Supervision for mobility/OOB     Equipment Recommendations  None recommended by PT    Recommendations for Other Services       Precautions / Restrictions Precautions Precautions: Back Precaution Booklet Issued: (issued in prior session ) Precaution Comments: Reviewed back precautions(pt able to recall 3/3 precautions ) Required Braces or Orthoses: Spinal Brace Spinal Brace: Lumbar corset;Applied in sitting position Restrictions Weight Bearing Restrictions: No    Mobility  Bed Mobility Overal bed mobility: Needs Assistance Bed Mobility: Sit to Sidelying;Rolling Rolling: Min assist       Sit to sidelying: Supervision General bed mobility comments: Received standing in room with OT  Transfers Overall transfer level: Needs assistance Equipment used: Rolling walker (2 wheeled) Transfers: Sit to/from Stand Sit to Stand: Mod assist         General transfer comment: Serial sit to stands x 3. Cues for scooting forward to edge of seat, feet shoulder width apart, and gaining forward momentum (without breaking precautions) to assist with anterior translation. Continuing to require moderate assistance and increased  time to achieve knee extension  Ambulation/Gait Ambulation/Gait assistance: Min guard Gait Distance (Feet): 100 Feet Assistive device: Rolling walker (2 wheeled) Gait Pattern/deviations: Step-to pattern;Step-through pattern;Narrow base of support;Decreased dorsiflexion - right;Decreased dorsiflexion - left Gait velocity: decr Gait velocity interpretation: <1.31 ft/sec, indicative of household ambulator General Gait Details: Cues for increased right foot clearance, walker proximity, and increased gait speed   Stairs             Wheelchair Mobility    Modified Rankin (Stroke Patients Only)       Balance Overall balance assessment: Needs assistance Sitting-balance support: Feet supported;No upper extremity supported Sitting balance-Leahy Scale: Fair Sitting balance - Comments: reliant on UE support, lateral lean with 0 hand support requiring min guard at times   Standing balance support: Bilateral upper extremity supported;During functional activity Standing balance-Leahy Scale: Poor Standing balance comment: reliant on B UE support                             Cognition Arousal/Alertness: Awake/alert Behavior During Therapy: WFL for tasks assessed/performed Overall Cognitive Status: Within Functional Limits for tasks assessed                                 General Comments: patient initally lethargic, but improves during session; reports "feeling weak"       Exercises General Exercises - Lower Extremity Long Arc Quad: 5 reps;Both;Seated    General Comments General comments (skin integrity, edema, etc.): spouse present; patient limited by "generalized weakness" requiring increased time for all tasks       Pertinent Vitals/Pain Pain  Assessment: Faces Faces Pain Scale: Hurts little more Pain Location: back/groin Pain Descriptors / Indicators: Grimacing;Guarding Pain Intervention(s): Monitored during session    Home Living                       Prior Function            PT Goals (current goals can now be found in the care plan section) Acute Rehab PT Goals Patient Stated Goal: to get home and feel better Potential to Achieve Goals: Good Progress towards PT goals: Progressing toward goals    Frequency    Min 5X/week      PT Plan Current plan remains appropriate    Co-evaluation              AM-PAC PT "6 Clicks" Daily Activity  Outcome Measure  Difficulty turning over in bed (including adjusting bedclothes, sheets and blankets)?: A Little Difficulty moving from lying on back to sitting on the side of the bed? : A Lot Difficulty sitting down on and standing up from a chair with arms (e.g., wheelchair, bedside commode, etc,.)?: Unable Help needed moving to and from a bed to chair (including a wheelchair)?: A Little Help needed walking in hospital room?: A Little Help needed climbing 3-5 steps with a railing? : A Lot 6 Click Score: 14    End of Session Equipment Utilized During Treatment: Gait belt Activity Tolerance: Patient tolerated treatment well Patient left: in chair;with call bell/phone within reach;with family/visitor present Nurse Communication: Mobility status PT Visit Diagnosis: Unsteadiness on feet (R26.81);Other abnormalities of gait and mobility (R26.89);Muscle weakness (generalized) (M62.81)     Time: [id number] PT Time Calculation (min) (ACUTE ONLY): 25 min  Charges:  $Gait Training: 8-22 mins $Therapeutic Activity: 8-22 mins                     Laurina Bustlearoline Brenda Samano, PT, DPT Acute Rehabilitation Services  Pager: [phone]    Allison Stevenson 12/03/2017, 9:35 AM

## 2017-12-03 NOTE — Progress Notes (Signed)
Occupational Therapy Treatment Patient Details Name: Allison Stevenson Beauchaine MRN: [id number] DOB: 1960/12/25 Today's Date: 12/03/2017    History of present illness Pt is a 57 y/o female s/p L4-5 ALIF and L2-4 lateral interbody fusion. 8/15 Posterior spinal fusion interbody L2-5. PMH includes thoracic outlet.    OT comments  Patient progressing slowly.  Continues to require modA for LB dressing and transfers.  Educated on AE for LB ADLs, reinforcement will be helpful.  Patient limited by pain and "generalized weakness" requiring increased time and effort for all activities.  Education continued with patient and spouse on precautions, ADL compensatory techniques and safety.  [facility]  Continue to recommend HHOT at discharge in order to maximize safety and independence with ADLs and mobility.  Will continue to follow while admitted.    Follow Up Recommendations  Home health OT;Supervision/Assistance - 24 hour    Equipment Recommendations  None recommended by OT    Recommendations for Other Services      Precautions / Restrictions Precautions Precautions: Back Precaution Booklet Issued: (issued in prior session ) Precaution Comments: Reviewed back precautions(pt able to recall 3/3 precautions ) Required Braces or Orthoses: Spinal Brace Spinal Brace: Lumbar corset;Applied in sitting position Restrictions Weight Bearing Restrictions: No       Mobility Bed Mobility Overal bed mobility: Needs Assistance Bed Mobility: Sit to Sidelying;Rolling Rolling: Min assist       Sit to sidelying: Supervision General bed mobility comments: able to complete log rolling technique without cueing; min A for L knee flexion from supine before rolling  Transfers Overall transfer level: Needs assistance Equipment used: Rolling walker (2 wheeled) Transfers: Sit to/from Stand Sit to Stand: Mod assist         General transfer comment: cueing for hand placement,  technique and safety     Balance Overall balance assessment: Needs assistance Sitting-balance support: Feet supported;No upper extremity supported Sitting balance-Leahy Scale: Fair Sitting balance - Comments: reliant on UE support, lateral lean with 0 hand support requiring min guard at times   Standing balance support: Bilateral upper extremity supported;During functional activity Standing balance-Leahy Scale: Poor Standing balance comment: reliant on B UE support                            ADL either performed or assessed with clinical judgement   ADL Overall ADL's : Needs assistance/impaired             Lower Body Bathing: Minimal assistance;Sitting/lateral leans;Cueing for back precautions;Cueing for safety Lower Body Bathing Details (indicate cue type and reason): educated patient and spouse on technique to bathe seated with lateral leans using long sponge     Lower Body Dressing: Sit to/from stand;Cueing for compensatory techniques;With adaptive equipment;Cueing for safety;Cueing for back precautions;Moderate assistance Lower Body Dressing Details (indicate cue type and reason): patient educated on AE for LB dressing (reacher, sock aide) but continues to require minA to complete LB dressing; impaired balance in standing and sitting today  Toilet Transfer: Moderate assistance;Ambulation;RW;Cueing for safety;Cueing for sequencing(simulated in room ) Toilet Transfer Details (indicate cue type and reason): modA to ascend into standing, cueing for hand placement and forward scoot; good posture with back       Tub/Shower Transfer Details (indicate cue type and reason): reviewed safety and recommended sponge bathing for safety at this [facility]  Functional mobility during ADLs: Minimal assistance;Rolling walker(for mobility in room only) General ADL  Comments: Patient continues to require increased time and effort for all activities, will have support at  home but needs assistance with ADLs.     Vision       Perception     Praxis      Cognition Arousal/Alertness: Awake/alert Behavior During Therapy: WFL for tasks assessed/performed Overall Cognitive Status: Within Functional Limits for tasks assessed                                 General Comments: patient initally lethargic, but improves during session; reports "feeling weak"         Exercises     Shoulder Instructions       General Comments spouse present; patient limited by "generalized weakness" requiring increased time for all tasks     Pertinent Vitals/ Pain       Pain Assessment: Faces Faces Pain Scale: Hurts little more Pain Location: back/groin Pain Descriptors / Indicators: Grimacing;Guarding Pain Intervention(s): Limited activity within patient's tolerance  Home Living                                          Prior Functioning/Environment              Frequency  Min 2X/week        Progress Toward Goals  OT Goals(current goals can now be found in the care plan section)  Progress towards OT goals: Progressing toward goals  Acute Rehab OT Goals Patient Stated Goal: to get home and feel better OT Goal Formulation: With patient Time For Goal Achievement: 12/16/17 Potential to Achieve Goals: Good  Plan Discharge plan remains appropriate;Frequency remains appropriate    Co-evaluation                 AM-PAC PT "6 Clicks" Daily Activity     Outcome Measure   Help from another person eating meals?: None Help from another person taking care of personal grooming?: A Little Help from another person toileting, which includes using toliet, bedpan, or urinal?: A Lot Help from another person bathing (including washing, rinsing, drying)?: A Lot Help from another person to put on and taking off regular upper body clothing?: None Help from another person to put on and taking off regular lower body clothing?: A  Lot 6 Click Score: 17    End of Session Equipment Utilized During Treatment: Gait belt;Rolling walker;Back brace  OT Visit Diagnosis: Other abnormalities of gait and mobility (R26.89);Muscle weakness (generalized) (M62.81);Pain Pain - Right/Left: Left Pain - part of body: (groin/back)   Activity Tolerance Patient tolerated treatment well   Patient Left Other (comment)(with PT)   Nurse Communication Mobility status        Time: 9604-5409: 0753-0815 OT Time Calculation (min): 22 min  Charges: OT General Charges $OT Visit: 1 Visit OT Treatments $Self Care/Home Management : 8-22 mins  Chancy Milroyhristie S Justice Milliron, OTR/L  Pager [phone]    Chancy MilroyChristie S Riana Tessmer 12/03/2017, 8:47 AM

## 2017-12-04 ENCOUNTER — Encounter (HOSPITAL_COMMUNITY): Payer: Self-pay

## 2017-12-04 MED ORDER — SENNA 8.6 MG PO TABS: 2.0000 | ORAL_TABLET | Freq: Two times a day (BID) | ORAL | 0 refills | Status: DC

## 2017-12-04 MED ORDER — DOCUSATE SODIUM 100 MG PO CAPS: 100.0000 mg | ORAL_CAPSULE | Freq: Two times a day (BID) | ORAL | 0 refills | Status: AC

## 2017-12-04 NOTE — Social Work (Signed)
CSW acknowledging consult for SNF placement, therapy recommending Home Health services and supervision. Home Health will be arranged through [employer] per Scientist, physiologicalN Case Manager note.  CSW signing off. Please consult if any additional needs arise.  Doy HutchingIsabel H Adriane Guglielmo, LCSWA [facility] Clinical Social Work [phone]

## 2017-12-04 NOTE — Plan of Care (Signed)
  Problem: Safety: Goal: Ability to remain free from injury will improve Outcome: Progressing   Problem: Education: Goal: Ability to verbalize activity precautions or restrictions will improve Outcome: Progressing   Problem: Education: Goal: Knowledge of the prescribed therapeutic regimen will improve Outcome: Progressing   Problem: Education: Goal: Understanding of discharge needs will improve Outcome: Progressing   Problem: Activity: Goal: Ability to avoid complications of mobility impairment will improve Outcome: Progressing   Problem: Activity: Goal: Ability to tolerate increased activity will improve Outcome: Progressing

## 2017-12-04 NOTE — Progress Notes (Signed)
Subjective: 2 Days Post-Op Procedure(s) (LRB): Posterior spinal fusion interbody L2-5 (N/A)  Patient reports pain as mild to moderate.  Reports that her strength and mobility are gradually improving each day.  Reports that she worked well with therapy.  Tolerating POs well.  Admits to flatus.  Denies fever, chills, N/V, CP, SOB.  Reports that she is ready to go home.  Objective:   VITALS:  Temp:  [98.4 F (36.9 C)-100.3 F (37.9 C)] 99.1 F (37.3 C) (08/17 0500) Pulse Rate:  [95-113] 95 (08/17 0500) Resp:  [16] 16 (08/16 1555) BP: (93-106)/(48-70) 99/59 (08/17 0500) SpO2:  [96 %-100 %] 98 % (08/17 0500)  General: WDWN patient in NAD. Psych:  Appropriate mood and affect. Neuro:  A&O x 3, Moving all extremities, sensation intact to light touch HEENT:  EOMs intact Chest:  Even non-labored respirations Skin:  Dressings C/D/I, no rashes or lesions Extremities: warm/dry, mild edema, no erythema or echymosis.  No lymphadenopathy. Pulses: Popliteus 2+ MSK:  ROM: TKE.  HF 3/5 on left, MMT: able to perform quad set, (-) Homan's    LABS No results for input(s): HGB, WBC, PLT in the last 72 hours. No results for input(s): NA, K, CL, CO2, BUN, CREATININE, GLUCOSE in the last 72 hours. No results for input(s): LABPT, INR in the last 72 hours.   Assessment/Plan: 2 Days Post-Op Procedure(s) (LRB): Posterior spinal fusion interbody L2-5 (N/A)  Patient seen in rounds for Dr. Shon BatonBrooks Up with therapy WBAT tolerated Plan to D/C home with HHPT Scripts on chart Plan for outpatient post-op visit with Dr. Theo DillsBrooks  Starleen Trussell PA-C [facility] Office:  [phone]

## 2017-12-04 NOTE — Progress Notes (Signed)
Occupational Therapy Treatment Patient Details Name: Salvadore DomMelody Mossbarger MRN: [id number] DOB: 1960-12-23 Today's Date: 12/04/2017    History of present illness Pt is a 57 y/o female s/p L4-5 ALIF and L2-4 lateral interbody fusion. 8/15 Posterior spinal fusion interbody L2-5. PMH includes thoracic outlet.    OT comments  Pt is making steady progress toward goals.  She is able to perform grooming standing at sink with min guard assist, and moves sit to stand with min guard assist today.   She is eager to return home, and feels confident with this as well as spouse's ability to assist.  Will continue to follow.   Follow Up Recommendations  Home health OT;Supervision/Assistance - 24 hour    Equipment Recommendations  None recommended by OT    Recommendations for Other Services      Precautions / Restrictions Precautions Precautions: Back Precaution Comments: Pt able to state 3/3 precautions, requires min cues during funcitonal activities  Required Braces or Orthoses: Spinal Brace Spinal Brace: Lumbar corset;Applied in sitting position       Mobility Bed Mobility               General bed mobility comments: Pt reports she slept in recliner as it is more comfortable than the bed resulting in reduction in pain   Transfers Overall transfer level: Needs assistance Equipment used: Rolling walker (2 wheeled) Transfers: Sit to/from [employer] CorporationStand;Stand Pivot Transfers Sit to Stand: Min guard Stand pivot transfers: Min guard       General transfer comment: Min guard for safety and balance.  Pt very pleased she was able to move sit to stand without assist     Balance Overall balance assessment: Needs assistance Sitting-balance support: Feet supported;No upper extremity supported Sitting balance-Leahy Scale: Fair     Standing balance support: During functional activity Standing balance-Leahy Scale: Fair Standing balance comment: able to perform grooming with no UE support is she widens  her BOS and leans into sink to stabilize a bit                            ADL either performed or assessed with clinical judgement   ADL Overall ADL's : Needs assistance/impaired     Grooming: Wash/dry hands;Wash/dry face;Oral care;Min guard;Standing Grooming Details (indicate cue type and reason): Pt demonstrates safe technique.  Min guard assist due to balance deficits                Lower Body Dressing Details (indicate cue type and reason): Pt verbalizes understanding of use and acquisition of AE for LB ADLs  Toilet Transfer: Min guard;Ambulation;Comfort height toilet;BSC;RW         Tub/Shower Transfer Details (indicate cue type and reason): Pt verbalizes [facility]/shower transfers before attempting to shower  Functional mobility during ADLs: Min guard;Rolling walker       Vision       Perception     Praxis      Cognition Arousal/Alertness: Awake/alert Behavior During Therapy: WFL for tasks assessed/performed Overall Cognitive Status: Within Functional Limits for tasks assessed                                          Exercises     Shoulder Instructions       General Comments Pt reports she feels confident with  return to home     Pertinent Vitals/ Pain       Pain Assessment: Faces Faces Pain Scale: Hurts little more Pain Location: back/groin Pain Descriptors / Indicators: Grimacing;Guarding Pain Intervention(s): Monitored during session  Home Living                                          Prior Functioning/Environment              Frequency  Min 2X/week        Progress Toward Goals  OT Goals(current goals can now be found in the care plan section)  Progress towards OT goals: Not progressing toward goals - comment     Plan Discharge plan remains appropriate    Co-evaluation                 AM-PAC PT "6 Clicks" Daily Activity     Outcome  Measure   Help from another person eating meals?: None Help from another person taking care of personal grooming?: A Little Help from another person toileting, which includes using toliet, bedpan, or urinal?: A Lot Help from another person bathing (including washing, rinsing, drying)?: A Lot Help from another person to put on and taking off regular upper body clothing?: None Help from another person to put on and taking off regular lower body clothing?: A Lot 6 Click Score: 17    End of Session Equipment Utilized During Treatment: Gait belt;Back brace;Rolling walker  OT Visit Diagnosis: Other abnormalities of gait and mobility (R26.89);Muscle weakness (generalized) (M62.81);Pain Pain - Right/Left: Left Pain - part of body: Hip   Activity Tolerance Patient tolerated treatment well   Patient Left in chair;with call bell/phone within reach   Nurse Communication Mobility status        Time: [id number] OT Time Calculation (min): 18 min  Charges: OT General Charges $OT Visit: 1 Visit OT Treatments $Self Care/Home Management : 8-22 mins  Reynolds AmericanWendi Terriyah Westra, OTR/L [phone]    Jeani HawkingConarpe, Chantel Teti M 12/04/2017, 10:07 AM

## 2017-12-04 NOTE — Discharge Summary (Signed)
Physician Discharge Summary  Patient ID: Allison Stevenson MRN: [id number] DOB/AGE: 1960-07-24 57 y.o.  Admit date: 12/01/2017 Discharge date: 12/04/2017  Admission Diagnoses: LBP; ; degenerative scoliosis; hx of arthritis, headache, thoracic outlet syndrome  Discharge Diagnoses:  Active Problems:   S/P lumbar fusion same as above  Discharged Condition: stable  Hospital Course: [facility] for elective 2 stage lumbar fusion.  On 12/01/17 she underwent anterior lumbar interbody fusion at L4-5, followed by interbody fusion L2-3 and L3-4.  She was then admitted to the hospital.  On 12/02/17 she went back to the OR for posterior supplemental pedicle screw fixation L2-5.  The patient tolerated the procedures well without complication.  She worked well with therapy.  She tolerated her stay well at the hospital, gradually regaining sensation, strength, and function each day.  She will be D/C'd home with HHPT on 12/04/17.  Consults: PT/OT/Case management  Significant Diagnostic Studies: radiology: X-Ray: To ensure satisfactory anatomic alignment during operative procedures.  Treatments: IV hydration, antibiotics: Ancef, analgesia: acetaminophen, Morphine and oxycodone, anticoagulation: lovenox and surgery: as stated above  Discharge Exam: Blood pressure (!) 99/59, pulse 95, temperature 99.1 F (37.3 C), temperature source Oral, resp. rate 16, height 5' 10.5" (1.791 m), weight 63 kg, last menstrual period 09/30/2010, SpO2 98 %. General: WDWN patient in NAD. Psych:  Appropriate mood and affect. Neuro:  A&O x 3, Moving all extremities, sensation intact to light touch HEENT:  EOMs intact Chest:  Even non-labored respirations Skin:  Incision C/D/I, no rashes or lesions Extremities: warm/dry, mild edema, no erythema or echymosis.  No lymphadenopathy. Pulses: Popliteus 2+ MSK:  ROM: TKE, full hand/wrist ROM, MMT: able to perform quad set, 5/5 grip strength, (-)  Homan's   Disposition: Discharge disposition: 01-Home or Self Care       Discharge Instructions    Call MD / Call 911   Complete by:  As directed    If you experience chest pain or shortness of breath, CALL 911 and be transported to the hospital emergency room.  If you develope a fever above 101 F, pus (white drainage) or increased drainage or redness at the wound, or calf pain, call your surgeon's office.   Constipation Prevention   Complete by:  As directed    Drink plenty of fluids.  Prune juice may be helpful.  You may use a stool softener, such as Colace (over the counter) 100 mg twice a day.  Use MiraLax (over the counter) for constipation as needed.   Diet - low sodium heart healthy   Complete by:  As directed    Incentive spirometry RT   Complete by:  As directed    Incentive spirometry RT   Complete by:  As directed    Increase activity slowly as tolerated   Complete by:  As directed      Allergies as of 12/04/2017      Reactions   Bee Venom Anaphylaxis, Swelling   "Throat closed up as a child"   Robaxin [methocarbamol] Other (See Comments)   tremors      Medication List    STOP taking these medications   acetaminophen 325 MG tablet Commonly known as:  TYLENOL   ibuprofen 200 MG tablet Commonly known as:  ADVIL,MOTRIN   naproxen sodium 220 MG tablet Commonly known as:  ALEVE     TAKE these medications   conjugated estrogens vaginal cream Commonly known as:  PREMARIN Place 1 Applicatorful vaginally every Friday at  6 PM.   docusate sodium 100 MG capsule Commonly known as:  COLACE Take 1 capsule (100 mg total) by mouth 2 (two) times daily. While taking narcotic pain medicine.   loratadine 10 MG tablet Commonly known as:  CLARITIN Take 10 mg by mouth daily as needed for allergies.   multivitamin with minerals Tabs tablet Take 1 tablet by mouth daily.   ondansetron 4 MG disintegrating tablet Commonly known as:  ZOFRAN-ODT Take 1 tablet (4 mg total)  by mouth every 8 (eight) hours as needed for nausea or vomiting.   oxyCODONE-acetaminophen 10-325 MG tablet Commonly known as:  PERCOCET Take 1 tablet by mouth every 6 (six) hours as needed for up to 5 days for pain.   senna 8.6 MG Tabs tablet Commonly known as:  SENOKOT Take 2 tablets (17.2 mg total) by mouth 2 (two) times daily.      Follow-up Information    Venita LickBrooks, Dahari, MD Follow up in 2 week(s).   Specialty:  Orthopedic Surgery Contact information: [address] [place] [address] [phone]           Signed: Lolly MustacheJustin Kismet Facemire PA-C [facility] Office:  [phone]

## 2017-12-04 NOTE — Progress Notes (Signed)
Physical Therapy Treatment Patient Details Name: Allison Stevenson MRN: [id number] DOB: May 26, 1960 Today's Date: 12/04/2017    History of Present Illness Pt is a 57 y/o female s/p L4-5 ALIF and L2-4 lateral interbody fusion. 8/15 Posterior spinal fusion interbody L2-5. PMH includes thoracic outlet.     PT Comments    Pt is progressing well with gait and mobility, requiring supervision overall and min guard assist on stairs.  Pt wanted some gentle LE exercises to preform to help increase bil leg strength (L weaker than R).  PT to follow acutely until d/c confirmed.     Follow Up Recommendations  Home health PT;Supervision for mobility/OOB     Equipment Recommendations  None recommended by PT    Recommendations for Other Services   NA     Precautions / Restrictions Precautions Precautions: Back Precaution Comments: Pt able to state 3/3 precautions, requires min cues during funcitonal activities  Required Braces or Orthoses: Spinal Brace Spinal Brace: Lumbar corset;Applied in sitting position    Mobility  Bed Mobility               General bed mobility comments: Pt was OOB in the recliner chair.   Transfers Overall transfer level: Needs assistance Equipment used: Rolling walker (2 wheeled) Transfers: Sit to/from Stand Sit to Stand: Supervision Stand pivot transfers: Supervision       General transfer comment: supervision for safety, safe hand placement, slow transitions.   Ambulation/Gait Ambulation/Gait assistance: Supervision Gait Distance (Feet): 180 Feet Assistive device: Rolling walker (2 wheeled) Gait Pattern/deviations: Step-through pattern;Shuffle;Trunk flexed Gait velocity: decr Gait velocity interpretation: 1.31 - 2.62 ft/sec, indicative of limited community ambulator General Gait Details: Verbal cues for upright posture, slow, steady gait with RW.     Stairs Stairs: Yes Stairs assistance: Min guard Stair Management: One rail Left;Sideways Number  of Stairs: 10 General stair comments: Stairs preformed side ways with min guard assit for safety.  Pt able to do the full flight simulating both home entry and flight to get up to her bedroom.           Balance Overall balance assessment: Needs assistance Sitting-balance support: Feet supported;No upper extremity supported Sitting balance-Leahy Scale: Good     Standing balance support: Bilateral upper extremity supported;No upper extremity supported;Single extremity supported Standing balance-Leahy Scale: Fair Standing balance comment: can wash hands at sink without UE support, but trunk was leaned against sink.                            Cognition Arousal/Alertness: Awake/alert Behavior During Therapy: WFL for tasks assessed/performed Overall Cognitive Status: Within Functional Limits for tasks assessed                                        Exercises General Exercises - Lower Extremity Gluteal Sets: AROM;Both;10 reps Long Arc Quad: AROM;Both;10 reps(3 second holds, left weaker than R) Hip Flexion/Marching: AROM;Both;10 reps(unable to lift left against gravity, cues for ab engagement) Toe Raises: AROM;Both;10 reps Heel Raises: AROM;Both;10 reps    General Comments General comments (skin integrity, edema, etc.): Pt reports she feels confident with return to home       Pertinent Vitals/Pain Pain Assessment: Faces Faces Pain Scale: Hurts little more Pain Location: back, bil legs Pain Descriptors / Indicators: Grimacing;Guarding Pain Intervention(s): Limited activity within patient's tolerance;Monitored during session;Repositioned  PT Goals (current goals can now be found in the care plan section) Acute Rehab PT Goals Patient Stated Goal: get stronger in her legs Progress towards PT goals: Progressing toward goals    Frequency    Min 5X/week      PT Plan Current plan remains appropriate       AM-PAC PT "6 Clicks" Daily  Activity  Outcome Measure  Difficulty turning over in bed (including adjusting bedclothes, sheets and blankets)?: A Little Difficulty moving from lying on back to sitting on the side of the bed? : A Lot Difficulty sitting down on and standing up from a chair with arms (e.g., wheelchair, bedside commode, etc,.)?: Unable Help needed moving to and from a bed to chair (including a wheelchair)?: A Little Help needed walking in hospital room?: A Little Help needed climbing 3-5 steps with a railing? : A Little 6 Click Score: 15    End of Session Equipment Utilized During Treatment: Back brace;Gait belt Activity Tolerance: Patient limited by fatigue;Patient limited by pain Patient left: in chair;with call bell/phone within reach   PT Visit Diagnosis: Unsteadiness on feet (R26.81);Other abnormalities of gait and mobility (R26.89);Muscle weakness (generalized) (M62.81)     Time: [id number] PT Time Calculation (min) (ACUTE ONLY): 23 min  Charges:  $Gait Training: 8-22 mins $Therapeutic Exercise: 8-22 mins          Moesha Sarchet B. Azie Mcconahy, PT, DPT [phone]            12/04/2017, 11:01 AM

## 2017-12-04 NOTE — Progress Notes (Signed)
Discharge criteria met. Vitals stable and charted. Went over AVS with patient and husband. Pt verbalizes understanding. Pt being taken home by husband. Pt escorted out via staff.

## 2018-01-12 DIAGNOSIS — M5416 Radiculopathy, lumbar region: Secondary | ICD-10-CM | POA: Insufficient documentation

## 2018-06-27 DIAGNOSIS — M5030 Other cervical disc degeneration, unspecified cervical region: Secondary | ICD-10-CM | POA: Insufficient documentation

## 2018-06-28 DIAGNOSIS — Z0000 Encounter for general adult medical examination without abnormal findings: Secondary | ICD-10-CM | POA: Diagnosis not present

## 2018-06-29 ENCOUNTER — Other Ambulatory Visit: Payer: Self-pay | Admitting: Physician Assistant

## 2018-06-29 DIAGNOSIS — Z1231 Encounter for screening mammogram for malignant neoplasm of breast: Secondary | ICD-10-CM

## 2018-07-27 ENCOUNTER — Ambulatory Visit: Payer: Self-pay

## 2018-09-08 ENCOUNTER — Ambulatory Visit: Payer: Self-pay

## 2019-02-28 ENCOUNTER — Other Ambulatory Visit: Payer: Self-pay

## 2019-02-28 DIAGNOSIS — Z20822 Contact with and (suspected) exposure to covid-19: Secondary | ICD-10-CM

## 2019-03-02 LAB — NOVEL CORONAVIRUS, NAA: SARS-CoV-2, NAA: NOT DETECTED

## 2019-03-13 ENCOUNTER — Other Ambulatory Visit: Payer: Self-pay

## 2019-03-13 DIAGNOSIS — Z20822 Contact with and (suspected) exposure to covid-19: Secondary | ICD-10-CM

## 2019-03-14 LAB — NOVEL CORONAVIRUS, NAA: SARS-CoV-2, NAA: DETECTED — AB

## 2019-03-20 ENCOUNTER — Telehealth: Payer: Self-pay | Admitting: *Deleted

## 2019-03-20 NOTE — Telephone Encounter (Signed)
Allison Stevenson from [facility] called requesting patient's contact information.

## 2019-06-29 ENCOUNTER — Other Ambulatory Visit: Payer: Self-pay | Admitting: Physician Assistant

## 2019-06-29 DIAGNOSIS — Z0000 Encounter for general adult medical examination without abnormal findings: Secondary | ICD-10-CM | POA: Diagnosis not present

## 2019-06-29 DIAGNOSIS — Z13220 Encounter for screening for lipoid disorders: Secondary | ICD-10-CM | POA: Diagnosis not present

## 2019-06-29 DIAGNOSIS — Z1231 Encounter for screening mammogram for malignant neoplasm of breast: Secondary | ICD-10-CM

## 2019-06-29 DIAGNOSIS — Z13820 Encounter for screening for osteoporosis: Secondary | ICD-10-CM

## 2019-06-29 DIAGNOSIS — Z131 Encounter for screening for diabetes mellitus: Secondary | ICD-10-CM | POA: Diagnosis not present

## 2019-07-04 DIAGNOSIS — Z1211 Encounter for screening for malignant neoplasm of colon: Secondary | ICD-10-CM | POA: Diagnosis not present

## 2019-07-28 ENCOUNTER — Ambulatory Visit: Payer: Self-pay | Attending: Internal Medicine

## 2019-07-28 DIAGNOSIS — Z23 Encounter for immunization: Secondary | ICD-10-CM

## 2019-07-28 NOTE — Progress Notes (Signed)
   Covid-19 Vaccination Clinic  Name:  Sakeenah Valcarcel    MRN: [id number] DOB: 09-13-1960  07/28/2019  Ms. Driggs was observed post Covid-19 immunization for 30 minutes based on pre-vaccination screening without incident. She was provided with Vaccine Information Sheet and instruction to access the V-Safe system.   Ms. Colwell was instructed to call 911 with any severe reactions post vaccine: Marland Kitchen Difficulty breathing  . Swelling of face and throat  . A fast heartbeat  . A bad rash all over body  . Dizziness and weakness   Immunizations Administered    Name Date Dose VIS Date Route   Pfizer COVID-19 Vaccine 07/28/2019  8:25 AM 0.3 mL 03/31/2019 Intramuscular   Manufacturer: [employer], [employer]   Lot: [id number]   NDC: [address]-6

## 2019-08-21 ENCOUNTER — Ambulatory Visit: Payer: Self-pay | Attending: Internal Medicine

## 2019-08-21 DIAGNOSIS — Z23 Encounter for immunization: Secondary | ICD-10-CM

## 2019-08-21 NOTE — Progress Notes (Signed)
   Covid-19 Vaccination Clinic  Name:  Allison Stevenson    MRN: [id number] DOB: [birth year]  08/21/2019  [name] was observed post Covid-19 immunization for 30 minutes based on pre-vaccination screening without incident. She was provided with Vaccine Information Sheet and instruction to access the V-Safe system.   [name] was instructed to call 911 with any severe reactions post vaccine: Marland Kitchen Difficulty breathing  . Swelling of face and throat  . A fast heartbeat  . A bad rash all over body  . Dizziness and weakness   Immunizations Administered    Name Date Dose VIS Date Route   Pfizer COVID-19 Vaccine 08/21/2019  9:47 AM 0.3 mL 06/14/2018 Intramuscular   Manufacturer: [employer], [employer]   Lot: [id number]   NDC: [address]-9

## 2019-09-11 DIAGNOSIS — Z1159 Encounter for screening for other viral diseases: Secondary | ICD-10-CM | POA: Diagnosis not present

## 2019-09-13 ENCOUNTER — Ambulatory Visit: Payer: BLUE CROSS/BLUE SHIELD

## 2019-09-13 ENCOUNTER — Other Ambulatory Visit: Payer: BLUE CROSS/BLUE SHIELD

## 2019-09-14 DIAGNOSIS — Z1211 Encounter for screening for malignant neoplasm of colon: Secondary | ICD-10-CM | POA: Diagnosis not present

## 2019-09-14 DIAGNOSIS — K648 Other hemorrhoids: Secondary | ICD-10-CM | POA: Diagnosis not present

## 2019-09-14 DIAGNOSIS — K5730 Diverticulosis of large intestine without perforation or abscess without bleeding: Secondary | ICD-10-CM | POA: Diagnosis not present

## 2019-10-10 ENCOUNTER — Ambulatory Visit
Admission: RE | Admit: 2019-10-10 | Discharge: 2019-10-10 | Disposition: A | Discharge status: Home / Self Care | Payer: BC Managed Care – PPO | Source: Ambulatory Visit | Attending: Physician Assistant | Admitting: Physician Assistant

## 2019-10-10 ENCOUNTER — Other Ambulatory Visit: Payer: Self-pay

## 2019-10-10 DIAGNOSIS — M85852 Other specified disorders of bone density and structure, left thigh: Secondary | ICD-10-CM | POA: Diagnosis not present

## 2019-10-10 DIAGNOSIS — Z1231 Encounter for screening mammogram for malignant neoplasm of breast: Secondary | ICD-10-CM | POA: Diagnosis not present

## 2019-10-10 DIAGNOSIS — Z780 Asymptomatic menopausal state: Secondary | ICD-10-CM | POA: Diagnosis not present

## 2019-10-10 DIAGNOSIS — Z13820 Encounter for screening for osteoporosis: Secondary | ICD-10-CM

## 2019-10-13 ENCOUNTER — Other Ambulatory Visit: Payer: Self-pay | Admitting: Physician Assistant

## 2019-10-13 DIAGNOSIS — R928 Other abnormal and inconclusive findings on diagnostic imaging of breast: Secondary | ICD-10-CM

## 2019-10-24 ENCOUNTER — Ambulatory Visit
Admission: RE | Admit: 2019-10-24 | Discharge: 2019-10-24 | Disposition: A | Discharge status: Home / Self Care | Payer: BC Managed Care – PPO | Source: Ambulatory Visit | Attending: Physician Assistant | Admitting: Physician Assistant

## 2019-10-24 ENCOUNTER — Other Ambulatory Visit: Payer: Self-pay | Admitting: Physician Assistant

## 2019-10-24 ENCOUNTER — Other Ambulatory Visit: Payer: Self-pay

## 2019-10-24 DIAGNOSIS — R921 Mammographic calcification found on diagnostic imaging of breast: Secondary | ICD-10-CM | POA: Diagnosis not present

## 2019-10-24 DIAGNOSIS — R928 Other abnormal and inconclusive findings on diagnostic imaging of breast: Secondary | ICD-10-CM

## 2020-04-26 ENCOUNTER — Other Ambulatory Visit: Payer: Self-pay | Admitting: Physician Assistant

## 2020-04-26 ENCOUNTER — Ambulatory Visit
Admission: RE | Admit: 2020-04-26 | Discharge: 2020-04-26 | Disposition: A | Discharge status: Home / Self Care | Payer: BC Managed Care – PPO | Source: Ambulatory Visit | Attending: Physician Assistant | Admitting: Physician Assistant

## 2020-04-26 ENCOUNTER — Other Ambulatory Visit: Payer: Self-pay

## 2020-04-26 DIAGNOSIS — R921 Mammographic calcification found on diagnostic imaging of breast: Secondary | ICD-10-CM

## 2020-04-26 DIAGNOSIS — R922 Inconclusive mammogram: Secondary | ICD-10-CM | POA: Diagnosis not present

## 2020-07-01 DIAGNOSIS — Z124 Encounter for screening for malignant neoplasm of cervix: Secondary | ICD-10-CM | POA: Diagnosis not present

## 2020-07-01 DIAGNOSIS — Z13220 Encounter for screening for lipoid disorders: Secondary | ICD-10-CM | POA: Diagnosis not present

## 2020-07-01 DIAGNOSIS — Z23 Encounter for immunization: Secondary | ICD-10-CM | POA: Diagnosis not present

## 2020-07-01 DIAGNOSIS — Z0000 Encounter for general adult medical examination without abnormal findings: Secondary | ICD-10-CM | POA: Diagnosis not present

## 2020-10-03 DIAGNOSIS — Z23 Encounter for immunization: Secondary | ICD-10-CM | POA: Diagnosis not present

## 2020-10-25 ENCOUNTER — Ambulatory Visit
Admission: RE | Admit: 2020-10-25 | Discharge: 2020-10-25 | Disposition: A | Discharge status: Home / Self Care | Payer: BC Managed Care – PPO | Source: Ambulatory Visit | Attending: Physician Assistant | Admitting: Physician Assistant

## 2020-10-25 ENCOUNTER — Other Ambulatory Visit: Payer: Self-pay

## 2020-10-25 DIAGNOSIS — R921 Mammographic calcification found on diagnostic imaging of breast: Secondary | ICD-10-CM

## 2022-07-10 ENCOUNTER — Other Ambulatory Visit: Payer: Self-pay | Admitting: Physician Assistant

## 2022-07-10 DIAGNOSIS — Z13820 Encounter for screening for osteoporosis: Secondary | ICD-10-CM

## 2022-09-15 IMAGING — MG DIGITAL DIAGNOSTIC BILAT W/ TOMO W/ CAD
6 of 10 series · 6 of 26 positions shown · non-contrast
Comparison: Previous exam(s).
COMPARISON: Previous exam(s).

Addendum:
CLINICAL DATA: 1+ year follow-up of probable benign right breast
calcifications originally seen on screening mammogram dated
10/10/2019.

EXAM:
DIGITAL DIAGNOSTIC BILATERAL MAMMOGRAM WITH TOMOSYNTHESIS AND CAD
TECHNIQUE: Bilateral digital diagnostic mammography and breast tomosynthesis
was performed. The images were evaluated with computer-aided
detection.

[R ML]
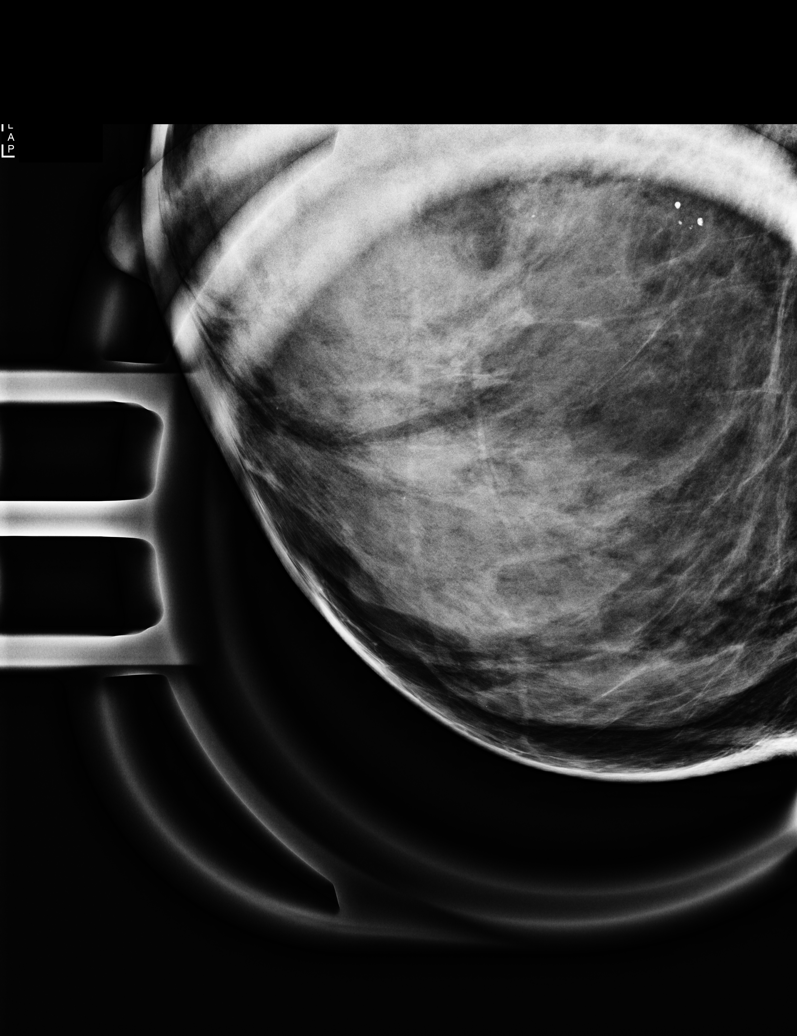

[R CC]
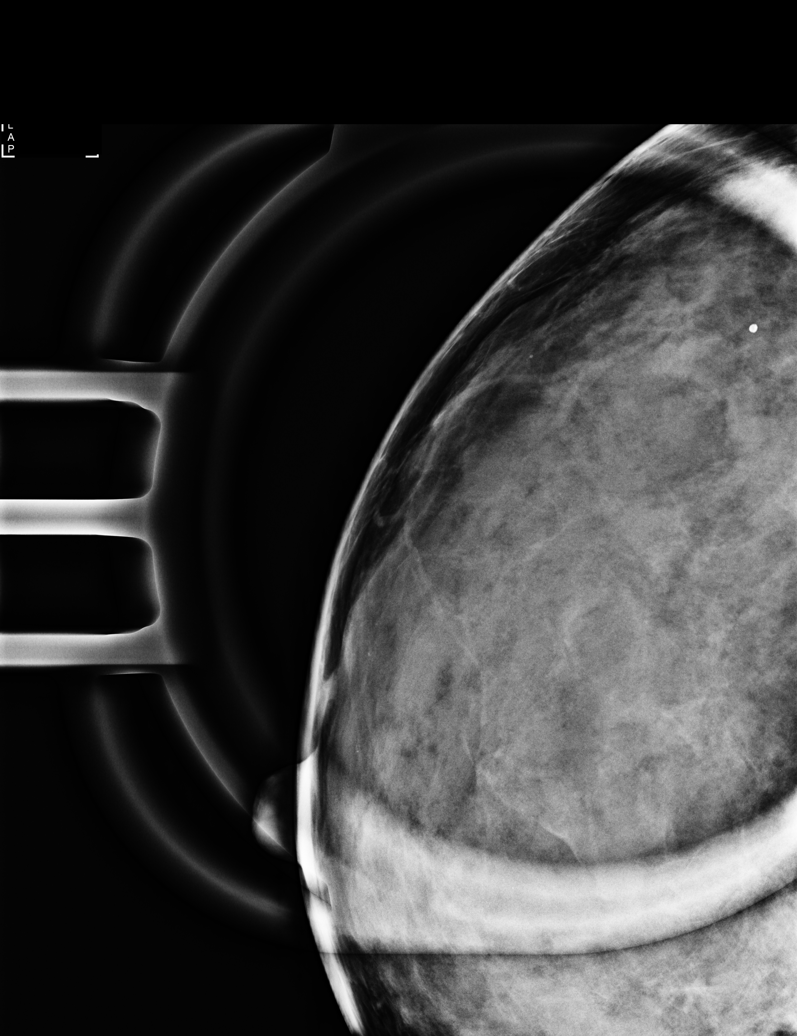

[R CC synth-2D]
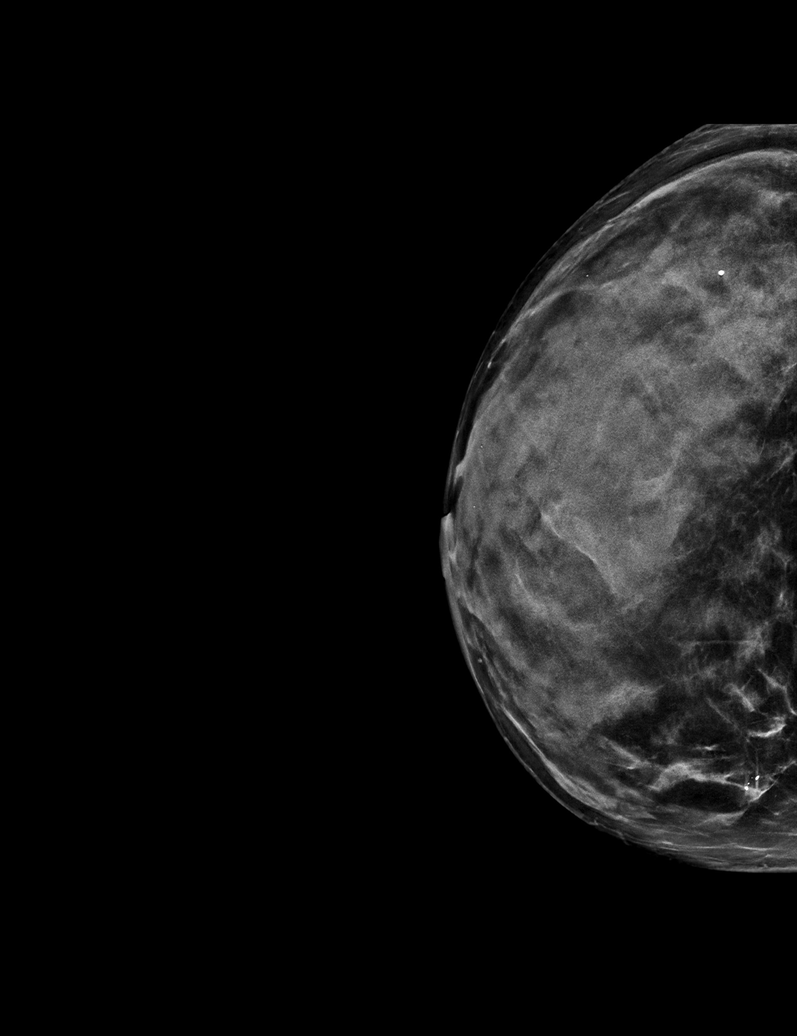

[R MLO synth-2D]
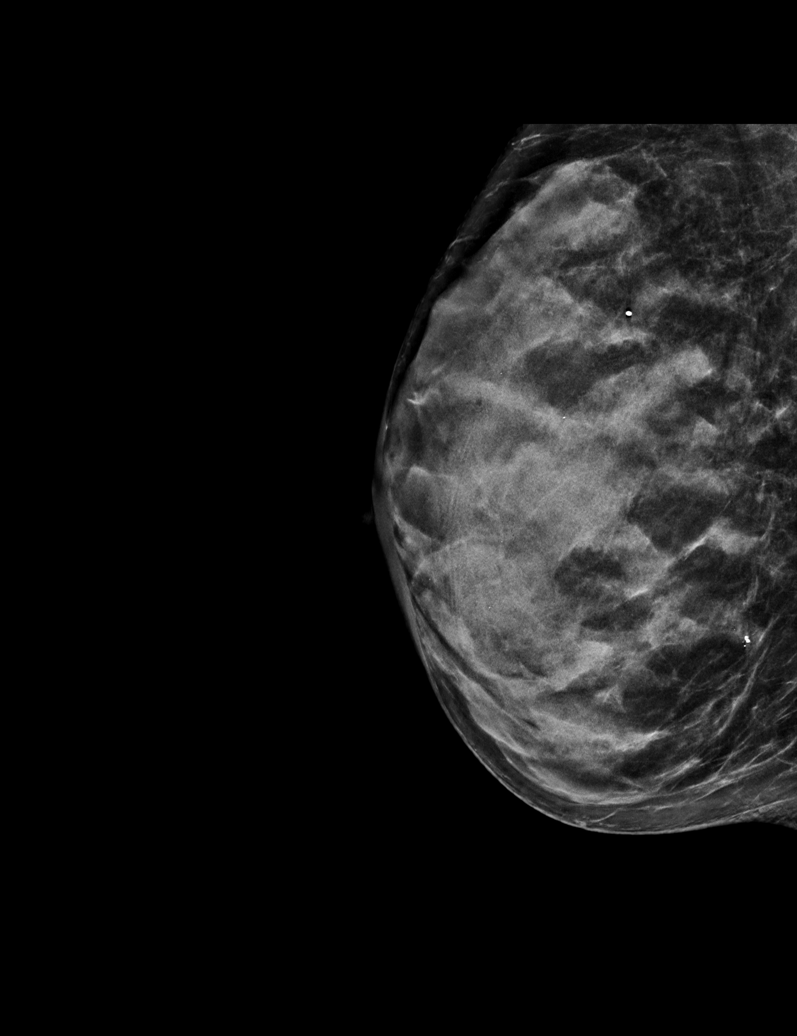

[L CC synth-2D]
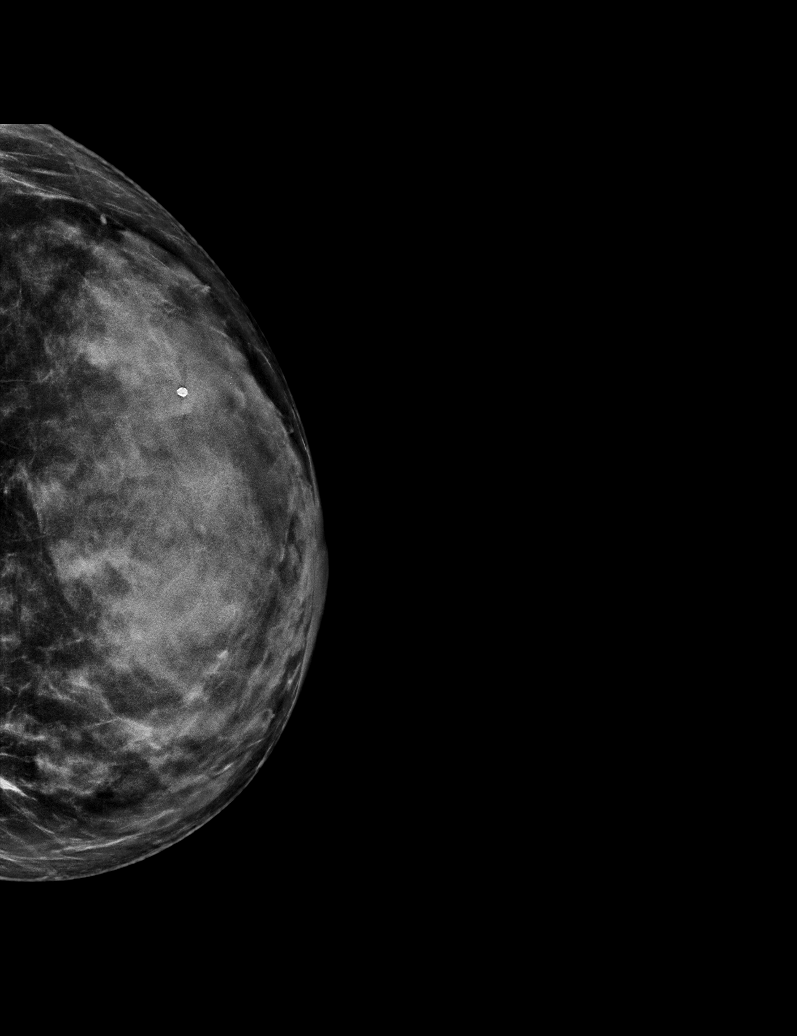

[L MLO synth-2D]
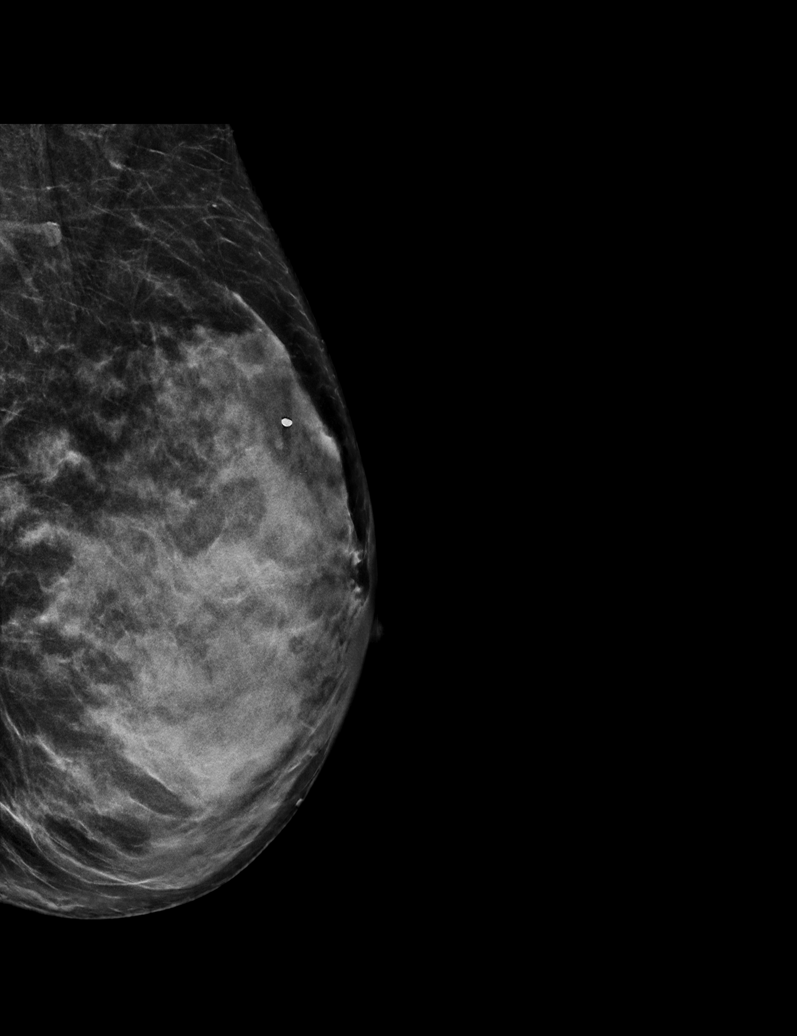

[6 of 26 positions shown; findings below may reference images not displayed]

ACR Breast Density Category d: The breast tissue is extremely dense,
which lowers the sensitivity of mammography.
FINDINGS: Loosely grouped calcifications in the lower outer quadrant of the
right breast are stable compared to prior mammogram dated
10/24/2019. No suspicious mass or malignant type microcalcifications
identified in either breast.
IMPRESSION: Stable probable benign calcifications in the right breast.

RECOMMENDATION:
Bilateral diagnostic mammogram in 1 year is recommended to document
stability of the calcifications in the right breast for 2 years.

I have discussed the findings and recommendations with the patient.
If applicable, a reminder letter will be sent to the patient
regarding the next appointment.

BI-RADS CATEGORY  Choose

ADDENDUM:
BI-RADS category: 3: Probably benign.

*** End of Addendum ***
ACR Breast Density Category d: The breast tissue is extremely dense,
which lowers the sensitivity of mammography.
FINDINGS: Loosely grouped calcifications in the lower outer quadrant of the
right breast are stable compared to prior mammogram dated
10/24/2019. No suspicious mass or malignant type microcalcifications
identified in either breast.
IMPRESSION: Stable probable benign calcifications in the right breast.

RECOMMENDATION:
Bilateral diagnostic mammogram in 1 year is recommended to document
stability of the calcifications in the right breast for 2 years.

I have discussed the findings and recommendations with the patient.
If applicable, a reminder letter will be sent to the patient
regarding the next appointment.

BI-RADS CATEGORY  Choose

## 2023-01-19 ENCOUNTER — Ambulatory Visit
Admission: RE | Admit: 2023-01-19 | Discharge: 2023-01-19 | Disposition: A | Discharge status: Home / Self Care | Payer: Medicare Other | Source: Ambulatory Visit | Attending: Physician Assistant | Admitting: Physician Assistant

## 2023-01-19 DIAGNOSIS — Z13820 Encounter for screening for osteoporosis: Secondary | ICD-10-CM

## 2023-04-05 ENCOUNTER — Emergency Department (HOSPITAL_COMMUNITY): Payer: Medicare Other

## 2023-04-05 ENCOUNTER — Other Ambulatory Visit: Payer: Self-pay

## 2023-04-05 ENCOUNTER — Inpatient Hospital Stay (HOSPITAL_COMMUNITY)
Admission: EM | Admit: 2023-04-05 | Discharge: 2023-04-10 | DRG: 871 | Disposition: A | Discharge status: Home / Self Care | Payer: Medicare Other | Attending: Internal Medicine | Admitting: Internal Medicine

## 2023-04-05 ENCOUNTER — Encounter (HOSPITAL_COMMUNITY): Payer: Self-pay

## 2023-04-05 DIAGNOSIS — A403 Sepsis due to Streptococcus pneumoniae: Secondary | ICD-10-CM | POA: Diagnosis present

## 2023-04-05 DIAGNOSIS — Z1152 Encounter for screening for COVID-19: Secondary | ICD-10-CM

## 2023-04-05 DIAGNOSIS — M419 Scoliosis, unspecified: Secondary | ICD-10-CM | POA: Diagnosis present

## 2023-04-05 DIAGNOSIS — M1990 Unspecified osteoarthritis, unspecified site: Secondary | ICD-10-CM | POA: Insufficient documentation

## 2023-04-05 DIAGNOSIS — E871 Hypo-osmolality and hyponatremia: Secondary | ICD-10-CM | POA: Diagnosis present

## 2023-04-05 DIAGNOSIS — A419 Sepsis, unspecified organism: Secondary | ICD-10-CM | POA: Diagnosis not present

## 2023-04-05 DIAGNOSIS — A491 Streptococcal infection, unspecified site: Secondary | ICD-10-CM | POA: Diagnosis not present

## 2023-04-05 DIAGNOSIS — E860 Dehydration: Secondary | ICD-10-CM | POA: Diagnosis not present

## 2023-04-05 DIAGNOSIS — J9601 Acute respiratory failure with hypoxia: Principal | ICD-10-CM | POA: Diagnosis present

## 2023-04-05 DIAGNOSIS — G629 Polyneuropathy, unspecified: Secondary | ICD-10-CM

## 2023-04-05 DIAGNOSIS — E876 Hypokalemia: Secondary | ICD-10-CM | POA: Diagnosis present

## 2023-04-05 DIAGNOSIS — D649 Anemia, unspecified: Secondary | ICD-10-CM | POA: Diagnosis present

## 2023-04-05 DIAGNOSIS — Z981 Arthrodesis status: Secondary | ICD-10-CM | POA: Diagnosis not present

## 2023-04-05 DIAGNOSIS — J121 Respiratory syncytial virus pneumonia: Secondary | ICD-10-CM | POA: Diagnosis present

## 2023-04-05 DIAGNOSIS — E8809 Other disorders of plasma-protein metabolism, not elsewhere classified: Secondary | ICD-10-CM | POA: Diagnosis present

## 2023-04-05 DIAGNOSIS — R6520 Severe sepsis without septic shock: Secondary | ICD-10-CM | POA: Diagnosis present

## 2023-04-05 DIAGNOSIS — J189 Pneumonia, unspecified organism: Secondary | ICD-10-CM | POA: Diagnosis not present

## 2023-04-05 DIAGNOSIS — D709 Neutropenia, unspecified: Secondary | ICD-10-CM | POA: Diagnosis present

## 2023-04-05 DIAGNOSIS — J13 Pneumonia due to Streptococcus pneumoniae: Secondary | ICD-10-CM | POA: Diagnosis not present

## 2023-04-05 DIAGNOSIS — H6691 Otitis media, unspecified, right ear: Secondary | ICD-10-CM | POA: Diagnosis present

## 2023-04-05 LAB — BASIC METABOLIC PANEL
Anion gap: 11 (ref 5–15)
BUN: 14 mg/dL (ref 8–23)
CO2: 22 mmol/L (ref 22–32)
Calcium: 8.6 mg/dL — ABNORMAL LOW (ref 8.9–10.3)
Chloride: 95 mmol/L — ABNORMAL LOW (ref 98–111)
Creatinine, Ser: 0.76 mg/dL (ref 0.44–1.00)
GFR, Estimated: >60 mL/min (ref >60)
Glucose, Bld: 97 mg/dL (ref 70–99)
Potassium: 3 mmol/L — ABNORMAL LOW (ref 3.5–5.1)
Sodium: 128 mmol/L — ABNORMAL LOW (ref 135–145)

## 2023-04-05 LAB — CBG MONITORING, ED: Glucose-Capillary: 89 mg/dL (ref 70–99)

## 2023-04-05 LAB — RESP PANEL BY RT-PCR (RSV, FLU A&B, COVID)  RVPGX2
Influenza A by PCR: NEGATIVE
Influenza B by PCR: NEGATIVE
Resp Syncytial Virus by PCR: POSITIVE — AB
SARS Coronavirus 2 by RT PCR: NEGATIVE

## 2023-04-05 LAB — URINALYSIS, ROUTINE W REFLEX MICROSCOPIC
Bilirubin Urine: NEGATIVE
Glucose, UA: NEGATIVE mg/dL
Hgb urine dipstick: NEGATIVE
Ketones, ur: NEGATIVE mg/dL
Leukocytes,Ua: NEGATIVE
Nitrite: NEGATIVE
Protein, ur: 100 mg/dL — AB
Specific Gravity, Urine: 1.026 (ref 1.005–1.030)
pH: 5 (ref 5.0–8.0)

## 2023-04-05 LAB — CBC
HCT: 36.8 % (ref 36.0–46.0)
Hemoglobin: 12.4 g/dL (ref 12.0–15.0)
MCH: 31.3 pg (ref 26.0–34.0)
MCHC: 33.7 g/dL (ref 30.0–36.0)
MCV: 92.9 fL (ref 80.0–100.0)
Platelets: 229 10*3/uL (ref 150–400)
RBC: 3.96 MIL/uL (ref 3.87–5.11)
RDW: 12.2 % (ref 11.5–15.5)
WBC: 3 10*3/uL — ABNORMAL LOW (ref 4.0–10.5)
nRBC: 0 % (ref 0.0–0.2)

## 2023-04-05 LAB — PROCALCITONIN: Procalcitonin: 4.45 ng/mL

## 2023-04-05 LAB — SODIUM, URINE, RANDOM: Sodium, Ur: <10 mmol/L

## 2023-04-05 LAB — MAGNESIUM: Magnesium: 2.1 mg/dL (ref 1.7–2.4)

## 2023-04-05 LAB — OSMOLALITY, URINE: Osmolality, Ur: 643 mosm/kg (ref 300–900)

## 2023-04-05 LAB — PHOSPHORUS: Phosphorus: 2.1 mg/dL — ABNORMAL LOW (ref 2.5–4.6)

## 2023-04-05 LAB — STREP PNEUMONIAE URINARY ANTIGEN: Strep Pneumo Urinary Antigen: POSITIVE — AB

## 2023-04-05 MED ORDER — ACETAMINOPHEN 325 MG PO TABS
650.0000 mg | ORAL_TABLET | Freq: Four times a day (QID) | ORAL | Status: DC | PRN
  Administered 2023-04-05 – 2023-04-10 (×12): 650 mg via ORAL
  Filled 2023-04-05 (×12): qty 2

## 2023-04-05 MED ORDER — ACETAMINOPHEN 650 MG RE SUPP: 650.0000 mg | Freq: Four times a day (QID) | RECTAL | Status: DC | PRN

## 2023-04-05 MED ORDER — SODIUM CHLORIDE 0.9 % IV BOLUS
250.0000 mL | Freq: Once | INTRAVENOUS | Status: AC
  Administered 2023-04-05: 250 mL via INTRAVENOUS

## 2023-04-05 MED ORDER — POTASSIUM CHLORIDE IN NACL 20-0.9 MEQ/L-% IV SOLN
INTRAVENOUS | Status: AC
  Filled 2023-04-05 (×2): qty 1000

## 2023-04-05 MED ORDER — MAGNESIUM SULFATE 2 GM/50ML IV SOLN
2.0000 g | Freq: Once | INTRAVENOUS | Status: AC
Start: 2023-04-05 — End: 2023-04-05
  Administered 2023-04-05: 2 g via INTRAVENOUS
  Filled 2023-04-05: qty 50

## 2023-04-05 MED ORDER — KETOROLAC TROMETHAMINE 15 MG/ML IJ SOLN: 15.0000 mg | Freq: Once | INTRAMUSCULAR | Status: DC

## 2023-04-05 MED ORDER — K PHOS MONO-SOD PHOS DI & MONO 155-852-130 MG PO TABS
500.0000 mg | ORAL_TABLET | Freq: Four times a day (QID) | ORAL | Status: AC
  Administered 2023-04-05 – 2023-04-06 (×3): 500 mg via ORAL
  Filled 2023-04-05 (×4): qty 2

## 2023-04-05 MED ORDER — SODIUM CHLORIDE 0.9 % IV SOLN
500.0000 mg | INTRAVENOUS | Status: DC
  Administered 2023-04-05 – 2023-04-07 (×3): 500 mg via INTRAVENOUS
  Filled 2023-04-05 (×3): qty 5

## 2023-04-05 MED ORDER — ALBUTEROL SULFATE HFA 108 (90 BASE) MCG/ACT IN AERS
2.0000 | INHALATION_SPRAY | RESPIRATORY_TRACT | Status: DC | PRN
  Administered 2023-04-05: 2 via RESPIRATORY_TRACT
  Filled 2023-04-05 (×2): qty 6.7

## 2023-04-05 MED ORDER — SODIUM CHLORIDE 0.9 % IV SOLN
1.0000 g | INTRAVENOUS | Status: DC
  Administered 2023-04-05 – 2023-04-10 (×6): 1 g via INTRAVENOUS
  Filled 2023-04-05 (×6): qty 10

## 2023-04-05 MED ORDER — ONDANSETRON HCL 4 MG PO TABS: 4.0000 mg | ORAL_TABLET | Freq: Four times a day (QID) | ORAL | Status: DC | PRN

## 2023-04-05 MED ORDER — DM-GUAIFENESIN ER 30-600 MG PO TB12
1.0000 | ORAL_TABLET | Freq: Two times a day (BID) | ORAL | Status: DC
  Administered 2023-04-05 – 2023-04-08 (×6): 1 via ORAL
  Filled 2023-04-05 (×6): qty 1

## 2023-04-05 MED ORDER — ORAL CARE MOUTH RINSE
15.0000 mL | OROMUCOSAL | Status: DC | PRN
Start: 2023-04-05 — End: 2023-04-10

## 2023-04-05 MED ORDER — ENOXAPARIN SODIUM 40 MG/0.4ML IJ SOSY
40.0000 mg | PREFILLED_SYRINGE | INTRAMUSCULAR | Status: DC
  Administered 2023-04-05 – 2023-04-09 (×5): 40 mg via SUBCUTANEOUS
  Filled 2023-04-05 (×5): qty 0.4

## 2023-04-05 MED ORDER — POTASSIUM CHLORIDE CRYS ER 20 MEQ PO TBCR
40.0000 meq | EXTENDED_RELEASE_TABLET | Freq: Once | ORAL | Status: AC
Start: 2023-04-05 — End: 2023-04-05
  Administered 2023-04-05: 40 meq via ORAL
  Filled 2023-04-05: qty 2

## 2023-04-05 MED ORDER — ONDANSETRON HCL 4 MG/2ML IJ SOLN: 4.0000 mg | Freq: Four times a day (QID) | INTRAMUSCULAR | Status: DC | PRN

## 2023-04-05 NOTE — ED Triage Notes (Signed)
PT arrives via POV. Pt reports she has been feeling sick since last Sunday. Currently reports productive cough, difficulty breathing, generalized weakness, body aches, headache, and right ear ache. States she recently had diarrhea, but that has improved. She has been around her family who have RSV. PT is AxOx4. Pt placed on 2lnc during triage.

## 2023-04-05 NOTE — ED Notes (Signed)
ED TO INPATIENT HANDOFF REPORT  ED Nurse Name and Phone #: Raphael Espe  S Name/Age/Gender Allison Stevenson 62 y.o. female Room/Bed: WA04/WA04  Code Status   Code Status: Full Code  Home/SNF/Other Home Patient oriented to: self, place, time, and situation Is this baseline? Yes   Triage Complete: Triage complete  Chief Complaint Acute respiratory failure with hypoxia (HCC) [J96.01]  Triage Note PT arrives via POV. Pt reports she has been feeling sick since last Sunday. Currently reports productive cough, difficulty breathing, generalized weakness, body aches, headache, and right ear ache. States she recently had diarrhea, but that has improved. She has been around her family who have RSV. PT is AxOx4. Pt placed on 2lnc during triage.    Allergies Allergies  Allergen Reactions   Bee Venom Anaphylaxis and Swelling    "Throat closed up as a child"   Pregabalin Other (See Comments)    Loopy, vision changes    Robaxin [Methocarbamol] Other (See Comments)    tremors    Level of Care/Admitting Diagnosis ED Disposition     ED Disposition  Admit   Condition  --   Comment  Hospital Area: [facility] [[id number]]  Level of Care: Telemetry [5]  Admit to tele based on following criteria: Monitor for Ischemic changes  May admit patient to [facility] or [facility] if equivalent level of care is available:: No  Covid Evaluation: Asymptomatic - no recent exposure (last 10 days) testing not required  Diagnosis: Acute respiratory failure with hypoxia [facility]) [[id number]]  Admitting Physician: Bobette Mo [[id number]]  Attending Physician: Bobette Mo [[id number]]  Certification:: I certify this patient will need inpatient services for at least 2 midnights  Expected Medical Readiness: 04/07/2023          B Medical/Surgery History Past Medical History:  Diagnosis Date   Arthritis    thumbs   Headache    history of migraines when on birth control and  artificial sweeteners   Thoracic outlet syndrome    Past Surgical History:  Procedure Laterality Date   ABDOMINAL EXPOSURE N/A 12/01/2017   Procedure: ABDOMINAL EXPOSURE;  Surgeon: Larina Earthly, MD;  Location: [facility] OR;  Service: Vascular;  Laterality: N/A;   ANTERIOR LAT LUMBAR FUSION N/A 12/01/2017   Procedure: ANTERIOR LATERAL LUMBAR FUSION 2 LEVELS;  Surgeon: Venita Lick, MD;  Location: [facility] OR;  Service: Orthopedics;  Laterality: N/A;   ANTERIOR LUMBAR FUSION N/A 12/01/2017   Procedure: Anterior lumbar fusion L4-5, anterior lateral lumbar fusion L2-4;  Surgeon: Venita Lick, MD;  Location: [facility] OR;  Service: Orthopedics;  Laterality: N/A;  Requests 6 hrs for both, Dr, Early to do Approach   CESAREAN SECTION     DILATION AND CURETTAGE OF UTERUS     after miscarriage   vein stripped       A IV Location/Drains/Wounds Patient Lines/Drains/Airways Status     Active Line/Drains/Airways     Name Placement date Placement time Site Days   Peripheral IV 12/01/17 Left;Lateral Wrist 12/01/17  0800  Wrist  1952            Intake/Output Last 24 hours No intake or output data in the 24 hours ending 04/05/23 1448  Labs/Imaging Results for orders placed or performed during the hospital encounter of 04/05/23 (from the past 48 hours)  Resp panel by RT-PCR (RSV, Flu A&B, Covid) Anterior Nasal Swab     Status: Abnormal   Collection Time: 04/05/23  9:38 AM   Specimen: Anterior  Nasal Swab  Result Value Ref Range   SARS Coronavirus 2 by RT PCR NEGATIVE NEGATIVE    Comment: (NOTE) SARS-CoV-2 target nucleic acids are NOT DETECTED.  The SARS-CoV-2 RNA is generally detectable in upper respiratory specimens during the acute phase of infection. The lowest concentration of SARS-CoV-2 viral copies this assay can detect is 138 copies/mL. A negative result does not preclude SARS-Cov-2 infection and should not be used as the sole basis for treatment or other patient management decisions. A negative  result may occur with  improper specimen collection/handling, submission of specimen other than nasopharyngeal swab, presence of viral mutation(s) within the areas targeted by this assay, and inadequate number of viral copies(<138 copies/mL). A negative result must be combined with clinical observations, patient history, and epidemiological information. The expected result is Negative.  Fact Sheet for Patients:  BloggerCourse.com  Fact Sheet for Healthcare Providers:  SeriousBroker.it  This test is no t yet approved or cleared by the [place] FDA and  has been authorized for detection and/or diagnosis of SARS-CoV-2 by FDA under an Emergency Use Authorization (EUA). This EUA will remain  in effect (meaning this test can be used) for the duration of the COVID-19 declaration under Section 564(b)(1) of the Act, 21 U.S.C.section 360bbb-3(b)(1), unless the authorization is terminated  or revoked sooner.       Influenza A by PCR NEGATIVE NEGATIVE   Influenza B by PCR NEGATIVE NEGATIVE    Comment: (NOTE) The Xpert Xpress SARS-CoV-2/FLU/RSV plus assay is intended as an aid in the diagnosis of influenza from Nasopharyngeal swab specimens and should not be used as a sole basis for treatment. Nasal washings and aspirates are unacceptable for Xpert Xpress SARS-CoV-2/FLU/RSV testing.  Fact Sheet for Patients: BloggerCourse.com  Fact Sheet for Healthcare Providers: SeriousBroker.it  This test is not yet approved or cleared by the [place] FDA and has been authorized for detection and/or diagnosis of SARS-CoV-2 by FDA under an Emergency Use Authorization (EUA). This EUA will remain in effect (meaning this test can be used) for the duration of the COVID-19 declaration under Section 564(b)(1) of the Act, 21 U.S.C. section 360bbb-3(b)(1), unless the authorization is terminated  or revoked.     Resp Syncytial Virus by PCR POSITIVE (A) NEGATIVE    Comment: (NOTE) Fact Sheet for Patients: BloggerCourse.com  Fact Sheet for Healthcare Providers: SeriousBroker.it  This test is not yet approved or cleared by the [place] FDA and has been authorized for detection and/or diagnosis of SARS-CoV-2 by FDA under an Emergency Use Authorization (EUA). This EUA will remain in effect (meaning this test can be used) for the duration of the COVID-19 declaration under Section 564(b)(1) of the Act, 21 U.S.C. section 360bbb-3(b)(1), unless the authorization is terminated or revoked.  Performed at [facility], [address], [place], [place] [address]   Basic metabolic panel     Status: Abnormal   Collection Time: 04/05/23  9:57 AM  Result Value Ref Range   Sodium 128 (L) 135 - 145 mmol/L   Potassium 3.0 (L) 3.5 - 5.1 mmol/L   Chloride 95 (L) 98 - 111 mmol/L   CO2 22 22 - 32 mmol/L   Glucose, Bld 97 70 - 99 mg/dL    Comment: Glucose reference range applies only to samples taken after fasting for at least 8 hours.   BUN 14 8 - 23 mg/dL   Creatinine, Ser 3.81 0.44 - 1.00 mg/dL   Calcium 8.6 (L) 8.9 - 10.3 mg/dL  GFR, Estimated >60 >60 mL/min    Comment: (NOTE) Calculated using the CKD-EPI Creatinine Equation (2021)    Anion gap 11 5 - 15    Comment: Performed at [facility], [address] [place], [place] [address]  CBC     Status: Abnormal   Collection Time: 04/05/23  9:57 AM  Result Value Ref Range   WBC 3.0 (L) 4.0 - 10.5 K/uL   RBC 3.96 3.87 - 5.11 MIL/uL   Hemoglobin 12.4 12.0 - 15.0 g/dL   HCT 60.4 54.0 - 98.1 %   MCV 92.9 80.0 - 100.0 fL   MCH 31.3 26.0 - 34.0 pg   MCHC 33.7 30.0 - 36.0 g/dL   RDW 19.1 47.8 - 29.5 %   Platelets 229 150 - 400 K/uL   nRBC 0.0 0.0 - 0.2 %    Comment: Performed at [facility], [address], [place],  [place] [address]  Procalcitonin     Status: None   Collection Time: 04/05/23  9:57 AM  Result Value Ref Range   Procalcitonin 4.45 ng/mL    Comment:        Interpretation: PCT > 2 ng/mL: Systemic infection (sepsis) is likely, unless other causes are known. (NOTE)       Sepsis PCT Algorithm           Lower Respiratory Tract                                      Infection PCT Algorithm    ----------------------------     ----------------------------         PCT < 0.25 ng/mL                PCT < 0.10 ng/mL          Strongly encourage             Strongly discourage   discontinuation of antibiotics    initiation of antibiotics    ----------------------------     -----------------------------       PCT 0.25 - 0.50 ng/mL            PCT 0.10 - 0.25 ng/mL               OR       >80% decrease in PCT            Discourage initiation of                                            antibiotics      Encourage discontinuation           of antibiotics    ----------------------------     -----------------------------         PCT >= 0.50 ng/mL              PCT 0.26 - 0.50 ng/mL               AND       <80% decrease in PCT              Encourage initiation of  antibiotics       Encourage continuation           of antibiotics    ----------------------------     -----------------------------        PCT >= 0.50 ng/mL                  PCT > 0.50 ng/mL               AND         increase in PCT                  Strongly encourage                                      initiation of antibiotics    Strongly encourage escalation           of antibiotics                                     -----------------------------                                           PCT <= 0.25 ng/mL                                                 OR                                        > 80% decrease in PCT                                      Discontinue / Do not initiate                                              antibiotics  Performed at [facility], [address], [place], [place] [address]   Magnesium     Status: None   Collection Time: 04/05/23  9:57 AM  Result Value Ref Range   Magnesium 2.1 1.7 - 2.4 mg/dL    Comment: Performed at [facility], [address], [place], [place] [address]  Phosphorus     Status: Abnormal   Collection Time: 04/05/23  9:57 AM  Result Value Ref Range   Phosphorus 2.1 (L) 2.5 - 4.6 mg/dL    Comment: Performed at [facility], [address], [place], [place] [address]  Urinalysis, Routine w reflex microscopic -Urine, Clean Catch     Status: Abnormal   Collection Time: 04/05/23 10:15 AM  Result Value Ref Range   Color, Urine AMBER (A) YELLOW    Comment: BIOCHEMICALS MAY BE AFFECTED BY COLOR   APPearance CLOUDY (A) CLEAR   Specific Gravity, Urine 1.026 1.005 - 1.030   pH 5.0  5.0 - 8.0   Glucose, UA NEGATIVE NEGATIVE mg/dL   Hgb urine dipstick NEGATIVE NEGATIVE   Bilirubin Urine NEGATIVE NEGATIVE   Ketones, ur NEGATIVE NEGATIVE mg/dL   Protein, ur 301 (A) NEGATIVE mg/dL   Nitrite NEGATIVE NEGATIVE   Leukocytes,Ua NEGATIVE NEGATIVE   RBC / HPF 6-10 0 - 5 RBC/hpf   WBC, UA 6-10 0 - 5 WBC/hpf   Bacteria, UA FEW (A) NONE SEEN   Squamous Epithelial / HPF 0-5 0 - 5 /HPF   Mucus PRESENT    Granular Casts, UA PRESENT     Comment: Performed at [facility], [address] [place], [place] [address]  CBG monitoring, ED     Status: None   Collection Time: 04/05/23 10:34 AM  Result Value Ref Range   Glucose-Capillary 89 70 - 99 mg/dL    Comment: Glucose reference range applies only to samples taken after fasting for at least 8 hours.   DG Chest 2 View Result Date: 04/05/2023 CLINICAL DATA:  Shortness of breath EXAM: CHEST - 2 VIEW COMPARISON:  X-ray 05/18/2006 FINDINGS: Hyperinflation. Normal cardiopericardial silhouette. No pneumothorax, edema or effusion.  There is consolidative opacity identified along the right lung base, lower lobe. Some mild changes on the left side as well in the left lower lobe. Possible pneumonia. Curvature of the spine with some degenerative changes. Overlapping cardiac leads. IMPRESSION: Bilateral lower lobe consolidative opacities, right-greater-than-left. Multifocal pneumonia possible. Recommend follow up to confirm clearance. Electronically Signed   By: Karen Kays M.D.   On: 04/05/2023 10:53    Pending Labs Unresulted Labs (From admission, onward)     Start     Ordered   04/06/23 0500  HIV Antibody (routine testing w rflx)  (HIV Antibody (Routine testing w reflex) panel)  Tomorrow morning,   R        04/05/23 1209   04/06/23 0500  CBC  Tomorrow morning,   R        04/05/23 1209   04/06/23 0500  Comprehensive metabolic panel  Tomorrow morning,   R        04/05/23 1209   04/05/23 1427  Sodium, urine, random  Once,   R        04/05/23 1427   04/05/23 1427  Osmolality, urine  Once,   R        04/05/23 1427   04/05/23 1208  Strep pneumoniae urinary antigen  (COPD / Pneumonia / Cellulitis / Lower Extremity Wound)  Once,   R        04/05/23 1209   04/05/23 1208  Legionella Pneumophila Serogp 1 Ur Ag  (COPD / Pneumonia / Cellulitis / Lower Extremity Wound)  Once,   R        04/05/23 1209   04/05/23 1208  Expectorated Sputum Assessment w Gram Stain, Rflx to Resp Cult  (COPD / Pneumonia / Cellulitis / Lower Extremity Wound)  Once,   R        04/05/23 1209            Vitals/Pain Today's Vitals   04/05/23 0938 04/05/23 0940 04/05/23 1100 04/05/23 1430  BP:   (!) 103/55 103/60  Pulse:   97 (!) 101  Resp:   (!) 22 (!) 28  Temp:   99.5 F (37.5 C)   TempSrc:   Oral   SpO2: 92%  98% 95%  PainSc:  8       Isolation Precautions No active isolations  Medications  Medications  albuterol (VENTOLIN HFA) 108 (90 Base) MCG/ACT inhaler 2 puff (2 puffs Inhalation Given 04/05/23 1003)  azithromycin (ZITHROMAX) 500 mg  in sodium chloride 0.9 % 250 mL IVPB (0 mg Intravenous Stopped 04/05/23 1333)  cefTRIAXone (ROCEPHIN) 1 g in sodium chloride 0.9 % 100 mL IVPB (0 g Intravenous Stopped 04/05/23 1205)  enoxaparin (LOVENOX) injection 40 mg (has no administration in time range)  acetaminophen (TYLENOL) tablet 650 mg (has no administration in time range)    Or  acetaminophen (TYLENOL) suppository 650 mg (has no administration in time range)  ondansetron (ZOFRAN) tablet 4 mg (has no administration in time range)    Or  ondansetron (ZOFRAN) injection 4 mg (has no administration in time range)  phosphorus (K PHOS NEUTRAL) tablet 500 mg (has no administration in time range)  0.9 % NaCl with KCl 20 mEq/ L  infusion (has no administration in time range)  dextromethorphan-guaiFENesin (MUCINEX DM) 30-600 MG per 12 hr tablet 1 tablet (has no administration in time range)  ketorolac (TORADOL) 15 MG/ML injection 15 mg (has no administration in time range)  potassium chloride SA (KLOR-CON M) CR tablet 40 mEq (40 mEq Oral Given 04/05/23 1212)  magnesium sulfate IVPB 2 g 50 mL (0 g Intravenous Stopped 04/05/23 1436)    Mobility walks with device     Focused Assessments    R Recommendations: See Admitting Provider Note  Report given to:   Additional Notes:

## 2023-04-05 NOTE — Progress Notes (Signed)
Johann Capers NP ordered a 250 mL Normal Saline Bolus to address low BP.

## 2023-04-05 NOTE — Progress Notes (Addendum)
1600-  pt arrived from [facility] with daughter at bedside  pt on 2 l 02 [place]   pt belongings to go home with daughter except for cell phone Safety measures in place call bell within reach   1930- pt calm resting in bed  no distress noted at this time  pt medicated per order. Pt on tele box 51  per order Verified iv fluids and rates w oncoming Rn Call bell w in reach  safety measures remain in place Handoff report completed at bedside with stephanie Rn

## 2023-04-05 NOTE — Progress Notes (Signed)
Provider Johann Capers NP) notified of BP 88/54 and BP 88/58. The Patient is alert and oriented x 4. Ambulated to the Bathroom without Syncope. Has Normal Saline with 20 meq of K running at 63 mL/hour. Waiting on Response.

## 2023-04-05 NOTE — Plan of Care (Signed)

## 2023-04-05 NOTE — ED Provider Notes (Signed)
[place] EMERGENCY DEPARTMENT AT [facility] Provider Note   CSN: [id number] Arrival date & time: 04/05/23  9563     History {Add pertinent medical, surgical, social history, OB history to HPI:1} Chief Complaint  Patient presents with   Shortness of Breath   Cough   Weakness    Allison Stevenson is a 62 y.o. female.  62 year old female presents with several days of cough and shortness of breath.  Has been feeling sick for about a week.  Taking care of a relative at home who was recently discharged from the hospital with pneumonia.  States cough is been nonproductive with some wheezing.  Has had bodyaches and headache.  Denies any current vomiting or diarrhea last 24 hours.  Family member had RSV.  Denies any urinary symptoms.       Home Medications Prior to Admission medications   Medication Sig Start Date End Date Taking? Authorizing Provider  conjugated estrogens (PREMARIN) vaginal cream Place 1 Applicatorful vaginally every Friday at 6 PM.    [provider]  docusate sodium (COLACE) 100 MG capsule Take 1 capsule (100 mg total) by mouth 2 (two) times daily. While taking narcotic pain medicine. 12/04/17   Jacinta Shoe, PA-C  loratadine (CLARITIN) 10 MG tablet Take 10 mg by mouth daily as needed for allergies.    [provider]  Multiple Vitamin (MULTIVITAMIN WITH MINERALS) TABS tablet Take 1 tablet by mouth daily.    [provider]  ondansetron (ZOFRAN ODT) 4 MG disintegrating tablet Take 1 tablet (4 mg total) by mouth every 8 (eight) hours as needed for nausea or vomiting. 12/02/17   Mayo, Baxter Kail, PA-C  senna (SENOKOT) 8.6 MG TABS tablet Take 2 tablets (17.2 mg total) by mouth 2 (two) times daily. 12/04/17   Jacinta Shoe, PA-C      Allergies    Bee venom and Robaxin [methocarbamol]    Review of Systems   Review of Systems  All other systems reviewed and are negative.   Physical Exam Updated Vital Signs BP  93/61   Pulse (!) 115   Temp 98.6 F (37 C) (Oral)   Resp 20   LMP 09/30/2010   SpO2 92%  Physical Exam Vitals and nursing note reviewed.  Constitutional:      General: She is not in acute distress.    Appearance: Normal appearance. She is well-developed. She is not toxic-appearing.  HENT:     Head: Normocephalic and atraumatic.  Eyes:     General: Lids are normal.     Conjunctiva/sclera: Conjunctivae normal.     Pupils: Pupils are equal, round, and reactive to light.  Neck:     Thyroid: No thyroid mass.     Trachea: No tracheal deviation.  Cardiovascular:     Rate and Rhythm: Regular rhythm. Tachycardia present.     Heart sounds: Normal heart sounds. No murmur heard.    No gallop.  Pulmonary:     Effort: Tachypnea and prolonged expiration present. No respiratory distress.     Breath sounds: No stridor. Examination of the right-lower field reveals wheezing. Examination of the left-lower field reveals wheezing. Wheezing present. No decreased breath sounds, rhonchi or rales.  Abdominal:     General: There is no distension.     Palpations: Abdomen is soft.     Tenderness: There is no abdominal tenderness. There is no rebound.  Musculoskeletal:        General: No tenderness. Normal range of motion.  Cervical back: Normal range of motion and neck supple.  Skin:    General: Skin is warm and dry.     Findings: No abrasion or rash.  Neurological:     Mental Status: She is alert and oriented to person, place, and time. Mental status is at baseline.     GCS: GCS eye subscore is 4. GCS verbal subscore is 5. GCS motor subscore is 6.     Cranial Nerves: No cranial nerve deficit.     Sensory: No sensory deficit.     Motor: Motor function is intact.  Psychiatric:        Attention and Perception: Attention normal.        Speech: Speech normal.        Behavior: Behavior normal.     ED Results / Procedures / Treatments   Labs (all labs ordered are listed, but only abnormal  results are displayed) Labs Reviewed  CBC - Abnormal; Notable for the following components:      Result Value   WBC 3.0 (*)    All other components within normal limits  RESP PANEL BY RT-PCR (RSV, FLU A&B, COVID)  RVPGX2  BASIC METABOLIC PANEL  URINALYSIS, ROUTINE W REFLEX MICROSCOPIC  CBG MONITORING, ED    EKG EKG Interpretation Date/Time:  Monday April 05 2023 09:50:59 EST Ventricular Rate:  100 PR Interval:  129 QRS Duration:  103 QT Interval:  329 QTC Calculation: 425 R Axis:   63  Text Interpretation: Sinus tachycardia RSR' in V1 or V2, right VCD or RVH Confirmed by Lorre Nick ([id number]) on 04/05/2023 10:09:36 AM  Radiology No results found.  Procedures Procedures  {Document cardiac monitor, telemetry assessment procedure when appropriate:1}  Medications Ordered in ED Medications  albuterol (VENTOLIN HFA) 108 (90 Base) MCG/ACT inhaler 2 puff (2 puffs Inhalation Given 04/05/23 1003)    ED Course/ Medical Decision Making/ A&P   {   Click here for ABCD2, HEART and other calculatorsREFRESH Note before signing :1}                              Medical Decision Making Amount and/or Complexity of Data Reviewed Labs: ordered. Radiology: ordered.  Risk Prescription drug management.   ***  {Document critical care time when appropriate:1} {Document review of labs and clinical decision tools ie heart score, Chads2Vasc2 etc:1}  {Document your independent review of radiology images, and any outside records:1} {Document your discussion with family members, caretakers, and with consultants:1} {Document social determinants of health affecting pt's care:1} {Document your decision making why or why not admission, treatments were needed:1} Final Clinical Impression(s) / ED Diagnoses Final diagnoses:  None    Rx / DC Orders ED Discharge Orders     None

## 2023-04-05 NOTE — H&P (Signed)
History and Physical    Patient: Allison Stevenson WGN:[id number] DOB: [birth year] DOA: 04/05/2023 DOS: the patient was seen and examined on 04/05/2023 PCP: Milus Height, PA  Patient coming from: Home  Chief Complaint:  Chief Complaint  Patient presents with   Shortness of Breath   Cough   Weakness   HPI: Allison Stevenson is a 62 y.o. 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  She also had diarrhea few days ago.  She has been feeling nauseous, but does not endorse emesis.   Lab work: Urinalysis was.,  Cloudy with 100 mg/dL of protein and a few bacteria microscopic examination.  Coronavirus and influenza PCR were negative, RSV PCR was positive.  CBC showed a white count of 3.0, hemoglobin 12.4 g/dL platelets 784.  Sodium 128, potassium 3.0, chloride 95 and CO2 22 mmol/L.  Normal glucose and renal function.  Calcium was 8.6 mg/dL.  Imaging: 2 view chest radiograph showing bilateral lower lobe consolidative opacities, right greater than left.  Multifocal pneumonia possible.  Follow-up recommended to confirm clearance.   ED course: 98.6 F, pulse 115, respiration 20, BP 93/61 mmHg O2 sat 89% on room air.  The patient received albuterol MDI, azithromycin and ceftriaxone.  Review of Systems: As mentioned in the history of present illness. All other systems reviewed and are negative. Past Medical History:  Diagnosis Date   Arthritis    thumbs   Headache    history of migraines when on birth control and artificial sweeteners   Thoracic outlet syndrome    Past Surgical History:  Procedure Laterality Date   ABDOMINAL EXPOSURE N/A 12/01/2017   Procedure: ABDOMINAL EXPOSURE;   Surgeon: Larina Earthly, MD;  Location: [facility] OR;  Service: Vascular;  Laterality: N/A;   ANTERIOR LAT LUMBAR FUSION N/A 12/01/2017   Procedure: ANTERIOR LATERAL LUMBAR FUSION 2 LEVELS;  Surgeon: Venita Lick, MD;  Location: [facility] OR;  Service: Orthopedics;  Laterality: N/A;   ANTERIOR LUMBAR FUSION N/A 12/01/2017   Procedure: Anterior lumbar fusion L4-5, anterior lateral lumbar fusion L2-4;  Surgeon: Venita Lick, MD;  Location: [facility] OR;  Service: Orthopedics;  Laterality: N/A;  Requests 6 hrs for both, Dr, Early to do Approach   CESAREAN SECTION     DILATION AND CURETTAGE OF UTERUS     after miscarriage   vein stripped     Social History:  reports that she has never smoked. She has never used smokeless tobacco. She reports that she does not drink alcohol and does not use drugs.  Allergies  Allergen Reactions   Bee Venom Anaphylaxis and Swelling    "Throat closed up as a child"   Robaxin [Methocarbamol] Other (See Comments)    tremors    History reviewed. No pertinent family history.  Prior to Admission medications   Medication Sig Start Date End Date Taking? Authorizing Provider  conjugated estrogens (PREMARIN) vaginal cream Place 1 Applicatorful vaginally every Friday at 6 PM.    [provider]  docusate sodium (COLACE) 100 MG capsule Take 1 capsule (100 mg total) by mouth 2 (two) times daily. While taking narcotic pain medicine. 12/04/17   Jacinta Shoe, PA-C  loratadine (CLARITIN) 10 MG tablet Take 10 mg by mouth daily as needed for allergies.  [provider]  Multiple Vitamin (MULTIVITAMIN WITH MINERALS) TABS tablet Take 1 tablet by mouth daily.    [provider]  ondansetron (ZOFRAN ODT) 4 MG disintegrating tablet Take 1 tablet (4 mg total) by mouth every 8 (eight) hours as needed for nausea or vomiting. 12/02/17   Mayo, Baxter Kail, PA-C  senna (SENOKOT) 8.6 MG TABS tablet Take 2 tablets (17.2 mg total) by mouth 2 (two) times daily. 12/04/17    Jacinta Shoe, PA-C    Physical Exam: Vitals:   04/05/23 0928 04/05/23 0938 04/05/23 1100  BP: 93/61  (!) 103/55  Pulse: (!) 115  97  Resp: 20  (!) 22  Temp: 98.6 F (37 C)  99.5 F (37.5 C)  TempSrc: Oral  Oral  SpO2: (!) 89% 92% 98%   Physical Exam Vitals and nursing note reviewed.  Constitutional:      General: She is awake. She is not in acute distress.    Appearance: She is normal weight. She is ill-appearing.     Interventions: Nasal cannula in place.  HENT:     Head: Normocephalic.     Nose: No rhinorrhea.     Mouth/Throat:     Mouth: Mucous membranes are dry.  Eyes:     Pupils: Pupils are equal, round, and reactive to light.  Neck:     Vascular: No JVD.  Cardiovascular:     Rate and Rhythm: Normal rate and regular rhythm.  Pulmonary:     Effort: Tachypnea present.     Breath sounds: Wheezing, rhonchi and rales present.  Abdominal:     General: Bowel sounds are normal. There is no distension.     Palpations: Abdomen is soft.  Musculoskeletal:     Cervical back: Neck supple.     Right lower leg: No edema.     Left lower leg: No edema.  Skin:    General: Skin is warm and dry.  Neurological:     General: No focal deficit present.     Mental Status: She is alert and oriented to person, place, and time.  Psychiatric:        Mood and Affect: Mood normal.        Behavior: Behavior normal. Behavior is cooperative.     Data Reviewed:  Results are pending, will review when available. EKG: Vent. rate 100 BPM PR interval 129 ms QRS duration 103 ms QT/QTcB 329/425 ms P-R-T axes 74 63 61 Sinus tachycardia RSR' in V1 or V2, right VCD or RVH  Assessment and Plan: Principal Problem:   Acute respiratory failure with hypoxia (HCC) In the setting of:   RSV (respiratory syncytial virus pneumonia) With superimposed   Multifocal pneumonia Admit to telemetry/inpatient. Continue supplemental oxygen. Scheduled and as needed bronchodilators. Continue  ceftriaxone 1 g IVPB daily. Continue azithromycin 500 mg IVPB daily. Check strep pneumoniae urinary antigen. Check Legionella urinary antigen. Check sputum Gram stain, culture and sensitivity. Follow-up CBC and chemistry in the morning.  Active Problems:   Hyponatremia Secondary to GI losses. Gentle IV hydration.    Hypokalemia Replacing.    Hypophosphatemia Replacing.    Hypocalcemia Recheck calcium with albumin level in AM. Further workup depending on results.    Neutropenia (HCC) Monitor platelet count.    Neuropathy Analgesics as needed.      Advance Care Planning:   Code Status: Full Code   Consults:   Family Communication: Her daughter was at bedside.  Severity of Illness: The appropriate patient status for this  patient is INPATIENT. Inpatient status is judged to be reasonable and necessary in order to provide the required intensity of service to ensure the patient's safety. The patient's presenting symptoms, physical exam findings, and initial radiographic and laboratory data in the context of their chronic comorbidities is felt to place them at high risk for further clinical deterioration. Furthermore, it is not anticipated that the patient will be medically stable for discharge from the hospital within 2 midnights of admission.   * I certify that at the point of admission it is my clinical judgment that the patient will require inpatient hospital care spanning beyond 2 midnights from the point of admission due to high intensity of service, high risk for further deterioration and high frequency of surveillance required.*  Author: Bobette Mo, MD 04/05/2023 11:41 AM  For on call review [email or web address].   This document was prepared using Dragon voice recognition software and may contain some unintended transcription errors.

## 2023-04-06 DIAGNOSIS — E876 Hypokalemia: Secondary | ICD-10-CM | POA: Diagnosis not present

## 2023-04-06 DIAGNOSIS — A419 Sepsis, unspecified organism: Secondary | ICD-10-CM | POA: Insufficient documentation

## 2023-04-06 DIAGNOSIS — R6520 Severe sepsis without septic shock: Secondary | ICD-10-CM

## 2023-04-06 DIAGNOSIS — J189 Pneumonia, unspecified organism: Secondary | ICD-10-CM

## 2023-04-06 DIAGNOSIS — J121 Respiratory syncytial virus pneumonia: Secondary | ICD-10-CM

## 2023-04-06 DIAGNOSIS — E871 Hypo-osmolality and hyponatremia: Secondary | ICD-10-CM

## 2023-04-06 DIAGNOSIS — J9601 Acute respiratory failure with hypoxia: Secondary | ICD-10-CM | POA: Diagnosis not present

## 2023-04-06 LAB — CBC
HCT: 33.2 % — ABNORMAL LOW (ref 36.0–46.0)
Hemoglobin: 10.8 g/dL — ABNORMAL LOW (ref 12.0–15.0)
MCH: 31.5 pg (ref 26.0–34.0)
MCHC: 32.5 g/dL (ref 30.0–36.0)
MCV: 96.8 fL (ref 80.0–100.0)
Platelets: 209 10*3/uL (ref 150–400)
RBC: 3.43 MIL/uL — ABNORMAL LOW (ref 3.87–5.11)
RDW: 12.7 % (ref 11.5–15.5)
WBC: 5.2 10*3/uL (ref 4.0–10.5)
nRBC: 0 % (ref 0.0–0.2)

## 2023-04-06 LAB — COMPREHENSIVE METABOLIC PANEL
ALT: 14 U/L (ref 0–44)
AST: 27 U/L (ref 15–41)
Albumin: 2.7 g/dL — ABNORMAL LOW (ref 3.5–5.0)
Alkaline Phosphatase: 63 U/L (ref 38–126)
Anion gap: 8 (ref 5–15)
BUN: 14 mg/dL (ref 8–23)
CO2: 23 mmol/L (ref 22–32)
Calcium: 8 mg/dL — ABNORMAL LOW (ref 8.9–10.3)
Chloride: 102 mmol/L (ref 98–111)
Creatinine, Ser: 0.63 mg/dL (ref 0.44–1.00)
GFR, Estimated: >60 mL/min (ref >60)
Glucose, Bld: 90 mg/dL (ref 70–99)
Potassium: 3.3 mmol/L — ABNORMAL LOW (ref 3.5–5.1)
Sodium: 133 mmol/L — ABNORMAL LOW (ref 135–145)
Total Bilirubin: 0.8 mg/dL (ref <1.2)
Total Protein: 5.9 g/dL — ABNORMAL LOW (ref 6.5–8.1)

## 2023-04-06 LAB — EXPECTORATED SPUTUM ASSESSMENT W GRAM STAIN, RFLX TO RESP C

## 2023-04-06 LAB — HIV ANTIBODY (ROUTINE TESTING W REFLEX): HIV Screen 4th Generation wRfx: NONREACTIVE

## 2023-04-06 MED ORDER — POTASSIUM CHLORIDE IN NACL 20-0.9 MEQ/L-% IV SOLN
INTRAVENOUS | Status: DC
  Filled 2023-04-06 (×2): qty 1000

## 2023-04-06 MED ORDER — SODIUM CHLORIDE 0.9 % IV BOLUS
500.0000 mL | Freq: Once | INTRAVENOUS | Status: AC
  Administered 2023-04-06: 500 mL via INTRAVENOUS

## 2023-04-06 MED ORDER — POTASSIUM CHLORIDE CRYS ER 20 MEQ PO TBCR
40.0000 meq | EXTENDED_RELEASE_TABLET | ORAL | Status: AC
  Administered 2023-04-06 (×2): 40 meq via ORAL
  Filled 2023-04-06 (×2): qty 2

## 2023-04-06 MED ORDER — BENZONATATE 100 MG PO CAPS
100.0000 mg | ORAL_CAPSULE | Freq: Once | ORAL | Status: AC
  Administered 2023-04-06: 100 mg via ORAL
  Filled 2023-04-06: qty 1

## 2023-04-06 NOTE — Evaluation (Signed)
Physical Therapy Evaluation Patient Details Name: Allison Stevenson MRN: [id number] DOB: [birth year] Today's Date: 04/06/2023  History of Present Illness  Pt is 62 yo female admitted on 04/05/23 with RSV and PNE. Pt with hx including but not limited to OA, thoracic outlet syndrome, lumbar fusion, carpal tunnel, ddd of c-spine and l-spine.  Clinical Impression  Pt admitted with above diagnosis. At baseline, pt is independent and takes care of her father and watches grandkids.  Today, she reports feeling much better and eager to walk. She was able to ambulate 400' safely without syncopal symptoms or dyspnea.  Pt likely to continue to progress on her own and no further PT needs.           If plan is discharge home, recommend the following:     Can travel by private vehicle        Equipment Recommendations None recommended by PT  Recommendations for Other Services       Functional Status Assessment Patient has not had a recent decline in their functional status     Precautions / Restrictions Precautions Precautions: None      Mobility  Bed Mobility Overal bed mobility: Independent                  Transfers Overall transfer level: Needs assistance Equipment used: None Transfers: Sit to/from Stand Sit to Stand: Supervision           General transfer comment: supervision for safety; pt has been hypotensive but took transfers slow and was asymptomatic    Ambulation/Gait Ambulation/Gait assistance: Supervision Gait Distance (Feet): 400 Feet Assistive device: None, IV Pole Gait Pattern/deviations: Step-through pattern, Decreased stride length Gait velocity: normal     General Gait Details: Ambulated with and without IV pole; steady gait  Stairs            Wheelchair Mobility     Tilt Bed    Modified Rankin (Stroke Patients Only)       Balance Overall balance assessment: Independent   Sitting balance-Leahy Scale: Normal       Standing  balance-Leahy Scale: Good                               Pertinent Vitals/Pain Pain Assessment Pain Assessment: No/denies pain    Home Living Family/patient expects to be discharged to:: Private residence Living Arrangements: Parent (Pt lives with her father and takes care of him) Available Help at Discharge: Family;Available PRN/intermittently Type of Home: House Home Access: Stairs to enter Entrance Stairs-Rails: Left Entrance Stairs-Number of Steps: 5 Alternate Level Stairs-Number of Steps: flight Home Layout: Two level;1/2 bath on main level Home Equipment: Pharmacist, hospital (2 wheels)      Prior Function Prior Level of Function : Independent/Modified Independent             Mobility Comments: Independent with community ambulation ADLs Comments: independent; takes care of father and grandkids     Extremity/Trunk Assessment   Upper Extremity Assessment Upper Extremity Assessment: Overall WFL for tasks assessed    Lower Extremity Assessment Lower Extremity Assessment: Overall WFL for tasks assessed    Cervical / Trunk Assessment Cervical / Trunk Assessment: Normal  Communication      Cognition Arousal: Alert Behavior During Therapy: WFL for tasks assessed/performed Overall Cognitive Status: Within Functional Limits for tasks assessed  General Comments General comments (skin integrity, edema, etc.): Pt tolerated well wtih no dynspnea or syncopal symptoms    Exercises     Assessment/Plan    PT Assessment Patient does not need any further PT services  PT Problem List         PT Treatment Interventions      PT Goals (Current goals can be found in the Care Plan section)  Acute Rehab PT Goals Patient Stated Goal: return home PT Goal Formulation: All assessment and education complete, DC therapy    Frequency       Co-evaluation               AM-PAC PT "6 Clicks"  Mobility  Outcome Measure Help needed turning from your back to your side while in a flat bed without using bedrails?: None Help needed moving from lying on your back to sitting on the side of a flat bed without using bedrails?: None Help needed moving to and from a bed to a chair (including a wheelchair)?: None Help needed standing up from a chair using your arms (e.g., wheelchair or bedside chair)?: None Help needed to walk in hospital room?: A Little Help needed climbing 3-5 steps with a railing? : A Little 6 Click Score: 22    End of Session Equipment Utilized During Treatment: Gait belt Activity Tolerance: Patient tolerated treatment well Patient left: in bed;with call bell/phone within reach Nurse Communication: Mobility status PT Visit Diagnosis: Other abnormalities of gait and mobility (R26.89)    Time: 4098-1191 PT Time Calculation (min) (ACUTE ONLY): 20 min   Charges:   PT Evaluation $PT Eval Low Complexity: 1 Low   PT General Charges $$ ACUTE PT VISIT: 1 Visit         Anise Salvo, PT Acute Rehab Services [facility] [phone]   Rayetta Humphrey 04/06/2023, 4:39 PM

## 2023-04-06 NOTE — Plan of Care (Signed)
  Problem: Nutrition: Goal: Adequate nutrition will be maintained Outcome: Progressing   Problem: Pain Management: Goal: General experience of comfort will improve Outcome: Progressing   Problem: Safety: Goal: Ability to remain free from injury will improve Outcome: Progressing

## 2023-04-06 NOTE — Progress Notes (Signed)
Allison Capers NP ordered an additional 500 mL Bolus due to low BP (90/55). Patient has already received 250 mL Bolus. Patient stated she has not been able to drink or eat very much.

## 2023-04-06 NOTE — Progress Notes (Signed)
   04/06/23 1328  TOC Brief Assessment  Insurance and Status Reviewed  Patient has primary care physician Yes  Home environment has been reviewed Single family home  Prior level of function: Independent  Prior/Current Home Services No current home services  Social Drivers of Health Review SDOH reviewed no interventions necessary  Readmission risk has been reviewed Yes  Transition of care needs no transition of care needs at this time

## 2023-04-06 NOTE — Progress Notes (Signed)
PROGRESS NOTE  Allison Stevenson:[id number] DOB: 09/19/1960   PCP: Milus Height, PA  Patient is from: Home  DOA: 04/05/2023 LOS: 1  Chief complaints Chief Complaint  Patient presents with   Shortness of Breath   Cough   Weakness     Brief Narrative / Interim history: 62 year old F with PMH of diffuse osteoarthritis/degenerative disc disease, prior lumbar fusion and thoracic outlet syndrome presenting with dyspnea, cough, wheezing, fatigue, myalgia, headache, decreased appetite and diarrhea, and admitted with working diagnosis of acute respiratory failure with hypoxia due to RSV infection and bacterial pneumonia.  She was slightly tachycardic and tachypneic with mild hypotension.  Desaturated to 89% on RA requiring 2 L to recover.  She had mild leukopenia.  CXR showed bilateral lower lobe consolidative opacities, right > left.  Pro-Cal elevated to 4.4.  Started on IV fluid, ceftriaxone and Zithromax and admitted.  Strep pneumo urinary antigen positive.  Remains on ceftriaxone and Zithromax.  Improving.  Subjective: Seen and examined earlier this morning.  No major events overnight of this morning.  Reports feeling better.  Cough is loosening up.  Still with dyspnea on exertion and generalized weakness.  Denies chest pain except when she coughs.  Daughters at bedside.  Objective: Vitals:   04/06/23 0500 04/06/23 0513 04/06/23 1311 04/06/23 1357  BP:  106/67 103/61   Pulse:  86 93   Resp: 20  (!) 21 (!) 21  Temp:  98.9 F (37.2 C) 98.8 F (37.1 C)   TempSrc:  Oral Axillary   SpO2:  99% 94%   Weight:      Height:        Examination:  GENERAL: No apparent distress.  Nontoxic. HEENT: MMM.  Vision and hearing grossly intact.  NECK: Supple.  No apparent JVD.  RESP:  No IWOB.  Fair aeration bilaterally.  Rhonchorous bilaterally.  Coughing frequently. CVS:  RRR. Heart sounds normal.  ABD/GI/GU: BS+. Abd soft, NTND.  MSK/EXT:  Moves extremities. No apparent deformity. No  edema.  SKIN: no apparent skin lesion or wound NEURO: Awake, alert and oriented appropriately.  No apparent focal neuro deficit. PSYCH: Calm. Normal affect.   Procedures:  None  Microbiology summarized: RSV PCR positive COVID-19 and influenza PCR negative Sputum culture pending  Assessment and plan: Severe sepsis with acute respiratory failure with hypoxia due to RSV infection and bacterial pneumonia: POA.  Was tachycardic with tachypnea, leukopenia and respiratory failure with hypoxemia to 89% on arrival.  CXR with bibasilar infiltrate/opacity.  RSV PCR positive.  Pro-Cal elevated to 4.45 suggesting bacterial infection.  Strep pneumo antigen positive.  Improving but still with frequent cough, rhonchi and tachypnea. -Continue ceftriaxone and Zithromax.   -Continue IV fluid -Continue bronchodilators, mucolytic's and antitussive -Wean oxygen as able -Incentive spirometry/OOB/PT/OT -Follow sputum culture -Continue droplet and contact precaution for RSV   Hyponatremia: Likely due to dehydration in the setting of acute illness.  Improved -Continue IV NS with KCl  Hypokalemia/hypophosphatemia/hypocalcemia:  -Monitor replenish as appropriate   Body mass index is 20.09 kg/m.          DVT prophylaxis:  enoxaparin (LOVENOX) injection 40 mg Start: 04/05/23 2200  Code Status: Full code Family Communication: Updated patient's daughters at bedside Level of care: Telemetry Status is: Inpatient Remains inpatient appropriate because: Severe sepsis/respiratory failure/multifocal pneumonia   Final disposition: Home once medically stable Consultants:  None  55 minutes with more than 50% spent in reviewing records, counseling patient/family and coordinating care.   Sch Meds:  Scheduled  Meds:  dextromethorphan-guaiFENesin  1 tablet Oral BID   enoxaparin (LOVENOX) injection  40 mg Subcutaneous Q24H   ketorolac  15 mg Intravenous Once   Continuous Infusions:  0.9 % NaCl with KCl  20 mEq / L 75 mL/hr at 04/06/23 1027   azithromycin 500 mg (04/06/23 1300)   cefTRIAXone (ROCEPHIN)  IV 1 g (04/06/23 1033)   PRN Meds:.acetaminophen **OR** acetaminophen, albuterol, ondansetron **OR** ondansetron (ZOFRAN) IV, mouth rinse  Antimicrobials: Anti-infectives (From admission, onward)    Start     Dose/Rate Route Frequency Ordered Stop   04/05/23 1130  azithromycin (ZITHROMAX) 500 mg in sodium chloride 0.9 % 250 mL IVPB        500 mg 250 mL/hr over 60 Minutes Intravenous Every 24 hours 04/05/23 1126     04/05/23 1130  cefTRIAXone (ROCEPHIN) 1 g in sodium chloride 0.9 % 100 mL IVPB        1 g 200 mL/hr over 30 Minutes Intravenous Every 24 hours 04/05/23 1126          I have personally reviewed the following labs and images: CBC: Recent Labs  Lab 04/05/23 0957 04/06/23 0537  WBC 3.0* 5.2  HGB 12.4 10.8*  HCT 36.8 33.2*  MCV 92.9 96.8  PLT 229 209   BMP &GFR Recent Labs  Lab 04/05/23 0957 04/06/23 0537  NA 128* 133*  K 3.0* 3.3*  CL 95* 102  CO2 22 23  GLUCOSE 97 90  BUN 14 14  CREATININE 0.76 0.63  CALCIUM 8.6* 8.0*  MG 2.1  --   PHOS 2.1*  --    Estimated Creatinine Clearance: 72.2 mL/min (by C-G formula based on SCr of 0.63 mg/dL). Liver & Pancreas: Recent Labs  Lab 04/06/23 0537  AST 27  ALT 14  ALKPHOS 63  BILITOT 0.8  PROT 5.9*  ALBUMIN 2.7*   No results for input(s): "LIPASE", "AMYLASE" in the last 168 hours. No results for input(s): "AMMONIA" in the last 168 hours. Diabetic: No results for input(s): "HGBA1C" in the last 72 hours. Recent Labs  Lab 04/05/23 1034  GLUCAP 89   Cardiac Enzymes: No results for input(s): "CKTOTAL", "CKMB", "CKMBINDEX", "TROPONINI" in the last 168 hours. No results for input(s): "PROBNP" in the last 8760 hours. Coagulation Profile: No results for input(s): "INR", "PROTIME" in the last 168 hours. Thyroid Function Tests: No results for input(s): "TSH", "T4TOTAL", "FREET4", "T3FREE", "THYROIDAB" in the  last 72 hours. Lipid Profile: No results for input(s): "CHOL", "HDL", "LDLCALC", "TRIG", "CHOLHDL", "LDLDIRECT" in the last 72 hours. Anemia Panel: No results for input(s): "VITAMINB12", "FOLATE", "FERRITIN", "TIBC", "IRON", "RETICCTPCT" in the last 72 hours. Urine analysis:    Component Value Date/Time   COLORURINE AMBER (A) 04/05/2023 1015   APPEARANCEUR CLOUDY (A) 04/05/2023 1015   LABSPEC 1.026 04/05/2023 1015   PHURINE 5.0 04/05/2023 1015   GLUCOSEU NEGATIVE 04/05/2023 1015   HGBUR NEGATIVE 04/05/2023 1015   BILIRUBINUR NEGATIVE 04/05/2023 1015   KETONESUR NEGATIVE 04/05/2023 1015   PROTEINUR 100 (A) 04/05/2023 1015   NITRITE NEGATIVE 04/05/2023 1015   LEUKOCYTESUR NEGATIVE 04/05/2023 1015   Sepsis Labs: Invalid input(s): "PROCALCITONIN", "LACTICIDVEN"  Microbiology: Recent Results (from the past 240 hours)  Resp panel by RT-PCR (RSV, Flu A&B, Covid) Anterior Nasal Swab     Status: Abnormal   Collection Time: 04/05/23  9:38 AM   Specimen: Anterior Nasal Swab  Result Value Ref Range Status   SARS Coronavirus 2 by RT PCR NEGATIVE NEGATIVE Final    Comment: (  NOTE) SARS-CoV-2 target nucleic acids are NOT DETECTED.  The SARS-CoV-2 RNA is generally detectable in upper respiratory specimens during the acute phase of infection. The lowest concentration of SARS-CoV-2 viral copies this assay can detect is 138 copies/mL. A negative result does not preclude SARS-Cov-2 infection and should not be used as the sole basis for treatment or other patient management decisions. A negative result may occur with  improper specimen collection/handling, submission of specimen other than nasopharyngeal swab, presence of viral mutation(s) within the areas targeted by this assay, and inadequate number of viral copies(<138 copies/mL). A negative result must be combined with clinical observations, patient history, and epidemiological information. The expected result is Negative.  Fact Sheet for  Patients:  BloggerCourse.com  Fact Sheet for Healthcare Providers:  SeriousBroker.it  This test is no t yet approved or cleared by the [place] FDA and  has been authorized for detection and/or diagnosis of SARS-CoV-2 by FDA under an Emergency Use Authorization (EUA). This EUA will remain  in effect (meaning this test can be used) for the duration of the COVID-19 declaration under Section 564(b)(1) of the Act, 21 U.S.C.section 360bbb-3(b)(1), unless the authorization is terminated  or revoked sooner.       Influenza A by PCR NEGATIVE NEGATIVE Final   Influenza B by PCR NEGATIVE NEGATIVE Final    Comment: (NOTE) The Xpert Xpress SARS-CoV-2/FLU/RSV plus assay is intended as an aid in the diagnosis of influenza from Nasopharyngeal swab specimens and should not be used as a sole basis for treatment. Nasal washings and aspirates are unacceptable for Xpert Xpress SARS-CoV-2/FLU/RSV testing.  Fact Sheet for Patients: BloggerCourse.com  Fact Sheet for Healthcare Providers: SeriousBroker.it  This test is not yet approved or cleared by the [place] FDA and has been authorized for detection and/or diagnosis of SARS-CoV-2 by FDA under an Emergency Use Authorization (EUA). This EUA will remain in effect (meaning this test can be used) for the duration of the COVID-19 declaration under Section 564(b)(1) of the Act, 21 U.S.C. section 360bbb-3(b)(1), unless the authorization is terminated or revoked.     Resp Syncytial Virus by PCR POSITIVE (A) NEGATIVE Final    Comment: (NOTE) Fact Sheet for Patients: BloggerCourse.com  Fact Sheet for Healthcare Providers: SeriousBroker.it  This test is not yet approved or cleared by the [place] FDA and has been authorized for detection and/or diagnosis of SARS-CoV-2 by FDA under an  Emergency Use Authorization (EUA). This EUA will remain in effect (meaning this test can be used) for the duration of the COVID-19 declaration under Section 564(b)(1) of the Act, 21 U.S.C. section 360bbb-3(b)(1), unless the authorization is terminated or revoked.  Performed at [facility], [address] [place], [place] [address]     Radiology Studies: No results found.    [facility]  If 7PM-7AM, please contact night-coverage [email or web address] 04/06/2023, 6:16 PM

## 2023-04-06 NOTE — Plan of Care (Signed)

## 2023-04-07 DIAGNOSIS — J9601 Acute respiratory failure with hypoxia: Secondary | ICD-10-CM | POA: Diagnosis not present

## 2023-04-07 LAB — RENAL FUNCTION PANEL
Albumin: 2.4 g/dL — ABNORMAL LOW (ref 3.5–5.0)
Anion gap: 7 (ref 5–15)
BUN: 15 mg/dL (ref 8–23)
CO2: 22 mmol/L (ref 22–32)
Calcium: 7.9 mg/dL — ABNORMAL LOW (ref 8.9–10.3)
Chloride: 100 mmol/L (ref 98–111)
Creatinine, Ser: 0.6 mg/dL (ref 0.44–1.00)
GFR, Estimated: >60 mL/min (ref >60)
Glucose, Bld: 86 mg/dL (ref 70–99)
Phosphorus: 2.8 mg/dL (ref 2.5–4.6)
Potassium: 3.8 mmol/L (ref 3.5–5.1)
Sodium: 129 mmol/L — ABNORMAL LOW (ref 135–145)

## 2023-04-07 LAB — SODIUM, URINE, RANDOM: Sodium, Ur: 49 mmol/L

## 2023-04-07 LAB — CBC
HCT: 29.6 % — ABNORMAL LOW (ref 36.0–46.0)
Hemoglobin: 10 g/dL — ABNORMAL LOW (ref 12.0–15.0)
MCH: 31.5 pg (ref 26.0–34.0)
MCHC: 33.8 g/dL (ref 30.0–36.0)
MCV: 93.4 fL (ref 80.0–100.0)
Platelets: 243 10*3/uL (ref 150–400)
RBC: 3.17 MIL/uL — ABNORMAL LOW (ref 3.87–5.11)
RDW: 12.8 % (ref 11.5–15.5)
WBC: 9.1 10*3/uL (ref 4.0–10.5)
nRBC: 0 % (ref 0.0–0.2)

## 2023-04-07 LAB — CORTISOL-AM, BLOOD: Cortisol - AM: 15.7 ug/dL (ref 6.7–22.6)

## 2023-04-07 LAB — OSMOLALITY: Osmolality: 282 mosm/kg (ref 275–295)

## 2023-04-07 LAB — MAGNESIUM: Magnesium: 2.2 mg/dL (ref 1.7–2.4)

## 2023-04-07 LAB — LEGIONELLA PNEUMOPHILA SEROGP 1 UR AG: L. pneumophila Serogp 1 Ur Ag: NEGATIVE

## 2023-04-07 MED ORDER — AZITHROMYCIN 250 MG PO TABS
500.0000 mg | ORAL_TABLET | Freq: Every day | ORAL | Status: AC
  Administered 2023-04-08 – 2023-04-09 (×2): 500 mg via ORAL
  Filled 2023-04-07 (×2): qty 2

## 2023-04-07 MED ORDER — SALINE SPRAY 0.65 % NA SOLN
1.0000 | NASAL | Status: DC | PRN
  Administered 2023-04-07: 1 via NASAL
  Filled 2023-04-07: qty 44

## 2023-04-07 MED ORDER — SODIUM CHLORIDE 1 G PO TABS: 1.0000 g | ORAL_TABLET | Freq: Two times a day (BID) | ORAL | Status: DC

## 2023-04-07 MED ORDER — LORATADINE 10 MG PO TABS
10.0000 mg | ORAL_TABLET | Freq: Every day | ORAL | Status: DC | PRN
  Administered 2023-04-07: 10 mg via ORAL
  Filled 2023-04-07: qty 1

## 2023-04-07 NOTE — Progress Notes (Signed)
Progress Note   Patient: Allison Stevenson:[id number] DOB: 1960-10-14 DOA: 04/05/2023     2 DOS: the patient was seen and examined on 04/07/2023   Brief hospital course: No notes on file  Assessment and Plan: Severe sepsis with acute respiratory failure with hypoxia due to RSV infection and bacterial pneumonia: POA.  Was tachycardic with tachypnea, leukopenia and respiratory failure with hypoxemia to 89% on arrival.  CXR with bibasilar infiltrate/opacity.  RSV PCR positive. But also Gram positive cocci in gram stain of sputum. Pro-Cal elevated to 4.45 suggesting bacterial infection.  Strep pneumo antigen positive.  Improving but still with frequent cough. Looking non toxic today. On room air at this time (at least on rest) -Continue ceftriaxone and Zithromax.   -s/p iv fluids. Hold same. -Continue bronchodilators, mucolytic's and antitussive -ambulatory pulse oxymetry today. -Incentive spirometry/OOB/PT/OT -Follow sputum culture for ID and sens. Thus far reasonable response to not be worried about the MRSA -Continue droplet and contact precaution for RSV   Otitis media -reports she has been having ear pain and right sided headache for the last 3 days on tympanic membrane evaluation this morning, does appear inflamed, although not bulging.  It is possible patient and mom otitis media at presentation due to Streptococcus.  This seems to be resolving beautifully at this time continue with clinical monitoring.  Ceftriaxone as above.  Continue with acetaminophen for pain for which patient is having good response.   Hyponatremia: Initial sodium was 128 2 days ago, improved to 133, down to 129 again today.  She will ascribed to dehydration from acute illness.  Patient initially received.  Received IV fluids that expired overnight.  Today, however patient is generally feeling better no nausea vomiting or diarrhea.  Today's drop in sodium level has to be interpreted in the context of improvement in  the potassium level.  Therefore I think that the patient serum osmolality has continued to improve.  I will check serum osmolality urine sodium and basically just trend this sodium level without intervention for now.  Check TSH and cortisol level  Hypokalemia/hypophosphatemia/hypocalcemia:  -Monitor replenish as appropriate         Subjective: Patient still reports coughing when she gets up and ambulates.  However at rest she reports no coughing or shortness of breath.  Patient reports much better energy.  Patient has been able to tolerate full meal earlier this morning and.  However patient is not adding any salt.  No vomiting no diarrhea.  Patient is reporting right ear pain and associated headache that is been going on for about 2 3 days. Respiratory exam: Bilateral intravesicular with bibasilar posterior fine crackles Cardiovascular exam S1-S2 normal Abdomen soft nontender Extremities warm without edema General Patient is alert awake oriented x 3 Tympanic membrane exams is done, there is erythematous streak on the right tympanic membrane without any bulging or generalized swelling of the tympanic membrane.  Physical Exam: Vitals:   04/06/23 1311 04/06/23 1357 04/06/23 2003 04/07/23 0509  BP: 103/61  (!) 106/58 117/74  Pulse: 93  86 74  Resp: (!) 21 (!) 21 19 14   Temp: 98.8 F (37.1 C)  99.7 F (37.6 C) 98.5 F (36.9 C)  TempSrc: Axillary  Oral Oral  SpO2: 94%  97% 96%  Weight:      Height:       Respiratory exam: Bilateral intravesicular with bibasilar posterior fine crackles Cardiovascular exam S1-S2 normal Abdomen soft nontender Extremities warm without edema General Patient is alert awake oriented  x 3 Tympanic membrane exams is done, there is erythematous streak on the right tympanic membrane without any bulging or generalized swelling of the tympanic membrane. General: Alert awake oriented x 3 no distress Data Reviewed:  Labs on Admission:  Results for orders placed  or performed during the hospital encounter of 04/05/23 (from the past 24 hours)  Expectorated Sputum Assessment w Gram Stain, Rflx to Resp Cult     Status: None   Collection Time: 04/06/23  7:15 PM   Specimen: Expectorated Sputum  Result Value Ref Range   Specimen Description EXPECTORATED SPUTUM    Special Requests NONE    Sputum evaluation      THIS SPECIMEN IS ACCEPTABLE FOR SPUTUM CULTURE Performed at [facility], [address], [place], [place] [address]    Report Status 04/06/2023 FINAL   Culture, Respiratory w Gram Stain     Status: None (Preliminary result)   Collection Time: 04/06/23  7:15 PM  Result Value Ref Range   Specimen Description      EXPECTORATED SPUTUM Performed at [facility], [address], [place], [place] [address]    Special Requests      NONE Reflexed from [id number] Performed at [facility], [address], [place], [place] [address]    Gram Stain      RARE WBC PRESENT, PREDOMINANTLY PMN RARE GRAM POSITIVE COCCI Performed at [facility], [address] [place], [place] [address]    Culture PENDING    Report Status PENDING   Renal function panel     Status: Abnormal   Collection Time: 04/07/23  5:32 AM  Result Value Ref Range   Sodium 129 (L) 135 - 145 mmol/L   Potassium 3.8 3.5 - 5.1 mmol/L   Chloride 100 98 - 111 mmol/L   CO2 22 22 - 32 mmol/L   Glucose, Bld 86 70 - 99 mg/dL   BUN 15 8 - 23 mg/dL   Creatinine, Ser 3.08 0.44 - 1.00 mg/dL   Calcium 7.9 (L) 8.9 - 10.3 mg/dL   Phosphorus 2.8 2.5 - 4.6 mg/dL   Albumin 2.4 (L) 3.5 - 5.0 g/dL   GFR, Estimated >65 >78 mL/min   Anion gap 7 5 - 15  Magnesium     Status: None   Collection Time: 04/07/23  5:32 AM  Result Value Ref Range   Magnesium 2.2 1.7 - 2.4 mg/dL  CBC     Status: Abnormal   Collection Time: 04/07/23  5:32 AM  Result Value Ref Range   WBC 9.1 4.0 - 10.5 K/uL   RBC 3.17 (L) 3.87 - 5.11 MIL/uL   Hemoglobin 10.0 (L)  12.0 - 15.0 g/dL   HCT 46.9 (L) 62.9 - 52.8 %   MCV 93.4 80.0 - 100.0 fL   MCH 31.5 26.0 - 34.0 pg   MCHC 33.8 30.0 - 36.0 g/dL   RDW 41.3 24.4 - 01.0 %   Platelets 243 150 - 400 K/uL   nRBC 0.0 0.0 - 0.2 %   Basic Metabolic Panel: Recent Labs  Lab 04/05/23 0957 04/06/23 0537 04/07/23 0532  NA 128* 133* 129*  K 3.0* 3.3* 3.8  CL 95* 102 100  CO2 22 23 22   GLUCOSE 97 90 86  BUN 14 14 15   CREATININE 0.76 0.63 0.60  CALCIUM 8.6* 8.0* 7.9*  MG 2.1  --  2.2  PHOS 2.1*  --  2.8   Liver Function Tests: Recent Labs  Lab 04/06/23 0537 04/07/23 0532  AST 27  --   ALT 14  --   ALKPHOS 63  --   BILITOT 0.8  --   PROT 5.9*  --   ALBUMIN 2.7* 2.4*   No results for input(s): "LIPASE", "AMYLASE" in the last 168 hours. No results for input(s): "AMMONIA" in the last 168 hours. CBC: Recent Labs  Lab 04/05/23 0957 04/06/23 0537 04/07/23 0532  WBC 3.0* 5.2 9.1  HGB 12.4 10.8* 10.0*  HCT 36.8 33.2* 29.6*  MCV 92.9 96.8 93.4  PLT 229 209 243   Cardiac Enzymes: No results for input(s): "CKTOTAL", "CKMB", "CKMBINDEX", "TROPONINIHS" in the last 168 hours.  BNP (last 3 results) No results for input(s): "PROBNP" in the last 8760 hours. CBG: Recent Labs  Lab 04/05/23 1034  GLUCAP 89    Radiological Exams on Admission:  Narrative & Impression  CLINICAL DATA:  Shortness of breath   EXAM: CHEST - 2 VIEW   COMPARISON:  X-ray 05/18/2006   FINDINGS: Hyperinflation. Normal cardiopericardial silhouette. No pneumothorax, edema or effusion. There is consolidative opacity identified along the right lung base, lower lobe. Some mild changes on the left side as well in the left lower lobe. Possible pneumonia. Curvature of the spine with some degenerative changes. Overlapping cardiac leads.   IMPRESSION: Bilateral lower lobe consolidative opacities, right-greater-than-left. Multifocal pneumonia possible. Recommend follow up to confirm clearance.       Family  Communication: per pateint. Daughter is abotu to come to hospital  Disposition: Status is: Inpatient Remains inpatient appropriate because: persistent concern for hyponatremia.  Planned Discharge Destination: Home    Time spent: 40 minutes  Author: Nolberto Hanlon, MD 04/07/2023 10:40 AM  For on call review [email or web address].

## 2023-04-07 NOTE — Progress Notes (Signed)
OT Cancellation Note  Patient Details Name: Allison Stevenson MRN: [id number] DOB: Sep 27, 1960   Cancelled Treatment:    Reason Eval/Treat Not Completed: OT screened, no needs identified, will sign off Patient reported being independent in room with PT eval from 12/17 reflecting the same. Patient endorsed not needing OT at this time.  OT to sign off at this time. Rosalio Loud, MS Acute Rehabilitation Department Office# [phone]  04/07/2023, 9:48 AM

## 2023-04-07 NOTE — Plan of Care (Signed)
  Problem: Clinical Measurements: Goal: Will remain free from infection Outcome: Progressing   Problem: Respiratory: Goal: Ability to maintain a clear airway will improve Outcome: Progressing   Problem: Respiratory: Goal: Ability to maintain adequate ventilation will improve Outcome: Progressing   Problem: Pain Management: Goal: General experience of comfort will improve Outcome: Progressing

## 2023-04-08 ENCOUNTER — Inpatient Hospital Stay (HOSPITAL_COMMUNITY): Payer: Medicare Other

## 2023-04-08 DIAGNOSIS — J9601 Acute respiratory failure with hypoxia: Secondary | ICD-10-CM | POA: Diagnosis not present

## 2023-04-08 DIAGNOSIS — D709 Neutropenia, unspecified: Secondary | ICD-10-CM

## 2023-04-08 DIAGNOSIS — J189 Pneumonia, unspecified organism: Secondary | ICD-10-CM | POA: Diagnosis not present

## 2023-04-08 DIAGNOSIS — E876 Hypokalemia: Secondary | ICD-10-CM | POA: Diagnosis not present

## 2023-04-08 DIAGNOSIS — J13 Pneumonia due to Streptococcus pneumoniae: Secondary | ICD-10-CM

## 2023-04-08 LAB — CBC WITH DIFFERENTIAL/PLATELET
Abs Immature Granulocytes: 0.42 10*3/uL — ABNORMAL HIGH (ref 0.00–0.07)
Basophils Absolute: 0 10*3/uL (ref 0.0–0.1)
Basophils Relative: 1 %
Eosinophils Absolute: 0.1 10*3/uL (ref 0.0–0.5)
Eosinophils Relative: 1 %
HCT: 31.9 % — ABNORMAL LOW (ref 36.0–46.0)
Hemoglobin: 10.4 g/dL — ABNORMAL LOW (ref 12.0–15.0)
Immature Granulocytes: 5 %
Lymphocytes Relative: 12 %
Lymphs Abs: 0.9 10*3/uL (ref 0.7–4.0)
MCH: 31.2 pg (ref 26.0–34.0)
MCHC: 32.6 g/dL (ref 30.0–36.0)
MCV: 95.8 fL (ref 80.0–100.0)
Monocytes Absolute: 0.4 10*3/uL (ref 0.1–1.0)
Monocytes Relative: 5 %
Neutro Abs: 6.2 10*3/uL (ref 1.7–7.7)
Neutrophils Relative %: 76 %
Platelets: UNDETERMINED 10*3/uL (ref 150–400)
RBC: 3.33 MIL/uL — ABNORMAL LOW (ref 3.87–5.11)
RDW: 13 % (ref 11.5–15.5)
WBC: 8.1 10*3/uL (ref 4.0–10.5)
nRBC: 0 % (ref 0.0–0.2)

## 2023-04-08 LAB — BASIC METABOLIC PANEL
Anion gap: 12 (ref 5–15)
BUN: 11 mg/dL (ref 8–23)
CO2: 22 mmol/L (ref 22–32)
Calcium: 8.2 mg/dL — ABNORMAL LOW (ref 8.9–10.3)
Chloride: 100 mmol/L (ref 98–111)
Creatinine, Ser: 0.45 mg/dL (ref 0.44–1.00)
GFR, Estimated: >60 mL/min (ref >60)
Glucose, Bld: 80 mg/dL (ref 70–99)
Potassium: 3.8 mmol/L (ref 3.5–5.1)
Sodium: 134 mmol/L — ABNORMAL LOW (ref 135–145)

## 2023-04-08 LAB — HEPATIC FUNCTION PANEL
ALT: 37 U/L (ref 0–44)
AST: 61 U/L — ABNORMAL HIGH (ref 15–41)
Albumin: 2.3 g/dL — ABNORMAL LOW (ref 3.5–5.0)
Alkaline Phosphatase: 87 U/L (ref 38–126)
Bilirubin, Direct: 0.2 mg/dL (ref 0.0–0.2)
Indirect Bilirubin: 0.5 mg/dL (ref 0.3–0.9)
Total Bilirubin: 0.7 mg/dL (ref <1.2)
Total Protein: 5.1 g/dL — ABNORMAL LOW (ref 6.5–8.1)

## 2023-04-08 LAB — TSH: TSH: 3.185 u[IU]/mL (ref 0.350–4.500)

## 2023-04-08 LAB — PHOSPHORUS: Phosphorus: 4.1 mg/dL (ref 2.5–4.6)

## 2023-04-08 LAB — MAGNESIUM: Magnesium: 1.9 mg/dL (ref 1.7–2.4)

## 2023-04-08 LAB — T4, FREE: Free T4: 0.95 ng/dL (ref 0.61–1.12)

## 2023-04-08 MED ORDER — ALBUTEROL SULFATE (2.5 MG/3ML) 0.083% IN NEBU: 2.5000 mg | INHALATION_SOLUTION | RESPIRATORY_TRACT | Status: DC | PRN

## 2023-04-08 MED ORDER — IPRATROPIUM-ALBUTEROL 0.5-2.5 (3) MG/3ML IN SOLN
3.0000 mL | Freq: Four times a day (QID) | RESPIRATORY_TRACT | Status: DC
  Administered 2023-04-09 (×3): 3 mL via RESPIRATORY_TRACT
  Filled 2023-04-08 (×3): qty 3

## 2023-04-08 MED ORDER — LEVALBUTEROL HCL 0.63 MG/3ML IN NEBU
0.6300 mg | INHALATION_SOLUTION | Freq: Four times a day (QID) | RESPIRATORY_TRACT | Status: DC
  Administered 2023-04-08 (×2): 0.63 mg via RESPIRATORY_TRACT
  Filled 2023-04-08 (×2): qty 3

## 2023-04-08 MED ORDER — IPRATROPIUM BROMIDE 0.02 % IN SOLN
0.5000 mg | Freq: Four times a day (QID) | RESPIRATORY_TRACT | Status: DC
  Administered 2023-04-08 (×2): 0.5 mg via RESPIRATORY_TRACT
  Filled 2023-04-08 (×2): qty 2.5

## 2023-04-08 MED ORDER — GUAIFENESIN ER 600 MG PO TB12
1200.0000 mg | ORAL_TABLET | Freq: Two times a day (BID) | ORAL | Status: DC
  Administered 2023-04-08 – 2023-04-10 (×4): 1200 mg via ORAL
  Filled 2023-04-08 (×4): qty 2

## 2023-04-08 NOTE — Plan of Care (Signed)
No acute events overnight. Problem: Education: Goal: Knowledge of General Education information will improve Description: Including pain rating scale, medication(s)/side effects and non-pharmacologic comfort measures Outcome: Progressing   Problem: Health Behavior/Discharge Planning: Goal: Ability to manage health-related needs will improve Outcome: Progressing   Problem: Clinical Measurements: Goal: Ability to maintain clinical measurements within normal limits will improve Outcome: Progressing Goal: Will remain free from infection Outcome: Progressing Goal: Diagnostic test results will improve Outcome: Progressing Goal: Respiratory complications will improve Outcome: Progressing Goal: Cardiovascular complication will be avoided Outcome: Progressing   Problem: Activity: Goal: Risk for activity intolerance will decrease Outcome: Progressing   Problem: Nutrition: Goal: Adequate nutrition will be maintained Outcome: Progressing   Problem: Coping: Goal: Level of anxiety will decrease Outcome: Progressing   Problem: Elimination: Goal: Will not experience complications related to bowel motility Outcome: Progressing Goal: Will not experience complications related to urinary retention Outcome: Progressing   Problem: Pain Management: Goal: General experience of comfort will improve Outcome: Progressing   Problem: Safety: Goal: Ability to remain free from injury will improve Outcome: Progressing   Problem: Skin Integrity: Goal: Risk for impaired skin integrity will decrease Outcome: Progressing   Problem: Activity: Goal: Ability to tolerate increased activity will improve Outcome: Progressing   Problem: Clinical Measurements: Goal: Ability to maintain a body temperature in the normal range will improve Outcome: Progressing   Problem: Respiratory: Goal: Ability to maintain adequate ventilation will improve Outcome: Progressing Goal: Ability to maintain a clear  airway will improve Outcome: Progressing

## 2023-04-08 NOTE — Progress Notes (Signed)
PROGRESS NOTE    Allison Stevenson  WGN:[id number] DOB: 1960-08-09 DOA: 04/05/2023 PCP: Milus Height, PA   Brief Narrative:  The patient is a 62 year old F with PMH of diffuse osteoarthritis/degenerative disc disease, prior lumbar fusion and thoracic outlet syndrome presenting with dyspnea, cough, wheezing, fatigue, myalgia, headache, decreased appetite and diarrhea, and admitted with working diagnosis of acute respiratory failure with hypoxia due to RSV infection and bacterial pneumonia.  She was slightly tachycardic and tachypneic with mild hypotension.  Desaturated to 89% on RA requiring 2 L to recover.  She had mild leukopenia.  CXR showed bilateral lower lobe consolidative opacities, right > left.  Pro-Cal elevated to 4.4.  Started on IV fluid, ceftriaxone and Zithromax and admitted.   Strep pneumo urinary antigen positive.  Remains on ceftriaxone and Zithromax.  Improving.  **Interim History  Slowly improving but desaturated on ambulatory Home O2 Screen today and now CXR showed worsening. Will add Xopenex/Atrovent as well as Brovana/Budesonide as well as Flutter and Guaifenesin 1200 mg po BID.   Assessment and Plan:  Severe Sepsis with Acute Respiratory Failure with hypoxia due to RSV infection and Superimposed bacterial Strep Pneumo Pneumonia:  -POA.  Was tachycardic with tachypnea, leukopenia and respiratory failure with hypoxemia to 89% on arrival.   -CXR with bibasilar infiltrate/opacity.  RSV PCR positive.  -But also Gram positive cocci in gram stain of sputum.  Pro-Cal elevated to 4.45 suggesting bacterial infection.  Strep pneumo antigen positive.   -Improving but still with frequent cough. Looking non toxic again today.  SpO2: 95 % O2 Flow Rate (L/min): 1.5 L/min -On room air at this time (at least on rest) but desaturated on Ambulation -Continue IV Ceftriaxone and Azithromycin for now -S/p IV fluids. Hold same. -Continue bronchodilators, mucolytic's and  antitussive -Continuous pulse oximetry maintain O2 saturation greater than 90% -Continue supplemental oxygen via nasal cannula and wean O2 as tolerated -Incentive spirometry/OOB/PT/OT -Add guaifenesin 1200 P.o. twice daily and flutter valve as well as Xopenex and Atrovent; Will also add Budesonide and Brovana -Follow sputum culture for ID and sensitivities. Thus far reasonable response to not be worried about the MRSA -Repeat CXR this AM done and showed "Worsening right greater than left airspace opacities with probable enlarging bilateral pleural effusions. Findings are suspicious for progressive pneumonia with parapneumonic effusions. Continued follow-up recommended." -Continue droplet and contact precaution for RSV -Repeat chest x-ray in the a.m.   Otitis Media  -She reports she has been having ear pain and right sided headache for the last 3 days on tympanic membrane evaluation this morning, does appear inflamed, although not bulging.   -It is possible patient and mom otitis media at presentation due to Streptococcus.   -This seems to be resolving at this time continue with clinical monitoring.   -C/w IV Ceftriaxone as above.   -Continue with acetaminophen for pain for which patient is having good response. -Will need outpatient evaluation by ENT if necessary    Hyponatremia -Na+ Trend: Recent Labs  Lab 04/05/23 0957 04/06/23 0537 04/07/23 0532 04/08/23 0616  NA 128* 133* 129* 134*  -She will ascribed to be dehydration from acute illness and received IVF -Patient is generally feeling better no nausea vomiting or diarrhea.  -Osmolality is 282 and TSH was 3.185 with a free T4 of 0.95 and urine sodium 49 -Continue to monitor and trend and repeat CMP in a.m.   Hypokalemia Hypophosphatemia Hypocalcemia:  -Electrolyte Trend: Recent Labs  Lab 04/05/23 0957 04/06/23 0537 04/07/23 0532 04/08/23 7846  K 3.0* 3.3* 3.8 3.8  PHOS 2.1*  --  2.8 4.1  CALCIUM 8.6* 8.0* 7.9* 8.2*   -Continue to Monitor and Trend and Replete as Necessary -Repeat CMP and Phos in the AM  Normocytic Anemia -Hgb/Hct Trend: Recent Labs  Lab 04/05/23 0957 04/06/23 0537 04/07/23 0532 04/08/23 0605  HGB 12.4 10.8* 10.0* 10.4*  HCT 36.8 33.2* 29.6* 31.9*  MCV 92.9 96.8 93.4 95.8  -Check Anemia Panel in the AM -Continue to Monitor for S/Sx of Bleeding; No overt bleeding noted -Repeat CBC in the AM   Hypoalbuminemia -Patient's Albumin Trend: Recent Labs  Lab 04/06/23 0537 04/07/23 0532 04/08/23 0605  ALBUMIN 2.7* 2.4* 2.3*  -Continue to Monitor and Trend and repeat CMP in the AM   DVT prophylaxis: enoxaparin (LOVENOX) injection 40 mg Start: 04/05/23 2200    Code Status: Full Code Family Communication: No family present at bedside   Disposition Plan:  Level of care: Telemetry Status is: Inpatient Remains inpatient appropriate because: Needs further clinical improvement and clearance   Consultants:  None  Procedures:  As delineated as above  Antimicrobials:  Anti-infectives (From admission, onward)    Start     Dose/Rate Route Frequency Ordered Stop   04/08/23 1000  azithromycin (ZITHROMAX) tablet 500 mg        500 mg Oral Daily 04/07/23 1412 04/10/23 0959   04/05/23 1130  azithromycin (ZITHROMAX) 500 mg in sodium chloride 0.9 % 250 mL IVPB  Status:  Discontinued        500 mg 250 mL/hr over 60 Minutes Intravenous Every 24 hours 04/05/23 1126 04/07/23 1412   04/05/23 1130  cefTRIAXone (ROCEPHIN) 1 g in sodium chloride 0.9 % 100 mL IVPB        1 g 200 mL/hr over 30 Minutes Intravenous Every 24 hours 04/05/23 1126         Subjective: Seen and examined at bedside and was doing okay.  Thinks her shortness of breath is getting better and that she feel stronger but unable to cough up sputum.  No lightheadedness or dizziness.  Desaturate on ambulatory home O2 screen.  No other concerns or complaints at this time.  Objective: Vitals:   04/07/23 2006 04/08/23 0459  04/08/23 1514 04/08/23 1530  BP: 118/72 123/78 121/74   Pulse: 87 74 73   Resp: 19 17 18    Temp: 98.9 F (37.2 C) 98.6 F (37 C) 98.7 F (37.1 C)   TempSrc: Oral Oral    SpO2: 94% 96% 98% 95%  Weight:      Height:        Intake/Output Summary (Last 24 hours) at 04/08/2023 1700 Last data filed at 04/08/2023 0857 Gross per 24 hour  Intake 103.64 ml  Output --  Net 103.64 ml   Filed Weights   04/05/23 1857  Weight: 63.5 kg   Examination: Physical Exam:  Constitutional: Thin Caucasian female in no acute distress Respiratory: Diminished to auscultation bilaterally with some coarse breath sounds and some crackles and some rhonchi noted but no appreciable wheezing or rales. Normal respiratory effort and patient is not tachypenic. No accessory muscle use.  Not wearing any supplemental oxygen via nasal cannula Cardiovascular: RRR, no murmurs / rubs / gallops. S1 and S2 auscultated. No appreciable extremity edema Abdomen: Soft, non-tender, non-distended. Bowel sounds positive.  GU: Deferred. Musculoskeletal: No clubbing / cyanosis of digits/nails. No joint deformity upper and lower extremities.  Skin: No rashes, lesions, ulcers on a limited skin evaluation. No induration; Warm and  dry.  Neurologic: CN 2-12 grossly intact with no focal deficits but is a little hard of hearing in the Right ear.  Romberg sign and cerebellar reflexes not assessed.  Psychiatric: Normal judgment and insight. Alert and oriented x 3. Normal mood and appropriate affect.   Data Reviewed: I have personally reviewed following labs and imaging studies  CBC: Recent Labs  Lab 04/05/23 0957 04/06/23 0537 04/07/23 0532 04/08/23 0605  WBC 3.0* 5.2 9.1 8.1  NEUTROABS  --   --   --  6.2  HGB 12.4 10.8* 10.0* 10.4*  HCT 36.8 33.2* 29.6* 31.9*  MCV 92.9 96.8 93.4 95.8  PLT 229 209 243 PLATELET CLUMPS NOTED ON SMEAR, UNABLE TO ESTIMATE   Basic Metabolic Panel: Recent Labs  Lab 04/05/23 0957 04/06/23 0537  04/07/23 0532 04/08/23 0605 04/08/23 0616  NA 128* 133* 129*  --  134*  K 3.0* 3.3* 3.8  --  3.8  CL 95* 102 100  --  100  CO2 22 23 22   --  22  GLUCOSE 97 90 86  --  80  BUN 14 14 15   --  11  CREATININE 0.76 0.63 0.60  --  0.45  CALCIUM 8.6* 8.0* 7.9*  --  8.2*  MG 2.1  --  2.2 1.9  --   PHOS 2.1*  --  2.8  --  4.1   GFR: Estimated Creatinine Clearance: 72.2 mL/min (by C-G formula based on SCr of 0.45 mg/dL). Liver Function Tests: Recent Labs  Lab 04/06/23 0537 04/07/23 0532 04/08/23 0605  AST 27  --  61*  ALT 14  --  37  ALKPHOS 63  --  87  BILITOT 0.8  --  0.7  PROT 5.9*  --  5.1*  ALBUMIN 2.7* 2.4* 2.3*   No results for input(s): "LIPASE", "AMYLASE" in the last 168 hours. No results for input(s): "AMMONIA" in the last 168 hours. Coagulation Profile: No results for input(s): "INR", "PROTIME" in the last 168 hours. Cardiac Enzymes: No results for input(s): "CKTOTAL", "CKMB", "CKMBINDEX", "TROPONINI" in the last 168 hours. BNP (last 3 results) No results for input(s): "PROBNP" in the last 8760 hours. HbA1C: No results for input(s): "HGBA1C" in the last 72 hours. CBG: Recent Labs  Lab 04/05/23 1034  GLUCAP 89   Lipid Profile: No results for input(s): "CHOL", "HDL", "LDLCALC", "TRIG", "CHOLHDL", "LDLDIRECT" in the last 72 hours. Thyroid Function Tests: Recent Labs    04/08/23 0616  TSH 3.185  FREET4 0.95   Anemia Panel: No results for input(s): "VITAMINB12", "FOLATE", "FERRITIN", "TIBC", "IRON", "RETICCTPCT" in the last 72 hours. Sepsis Labs: Recent Labs  Lab 04/05/23 0957  PROCALCITON 4.45    Recent Results (from the past 240 hours)  Resp panel by RT-PCR (RSV, Flu A&B, Covid) Anterior Nasal Swab     Status: Abnormal   Collection Time: 04/05/23  9:38 AM   Specimen: Anterior Nasal Swab  Result Value Ref Range Status   SARS Coronavirus 2 by RT PCR NEGATIVE NEGATIVE Final    Comment: (NOTE) SARS-CoV-2 target nucleic acids are NOT DETECTED.  The  SARS-CoV-2 RNA is generally detectable in upper respiratory specimens during the acute phase of infection. The lowest concentration of SARS-CoV-2 viral copies this assay can detect is 138 copies/mL. A negative result does not preclude SARS-Cov-2 infection and should not be used as the sole basis for treatment or other patient management decisions. A negative result may occur with  improper specimen collection/handling, submission of specimen other than nasopharyngeal  swab, presence of viral mutation(s) within the areas targeted by this assay, and inadequate number of viral copies(<138 copies/mL). A negative result must be combined with clinical observations, patient history, and epidemiological information. The expected result is Negative.  Fact Sheet for Patients:  BloggerCourse.com  Fact Sheet for Healthcare Providers:  SeriousBroker.it  This test is no t yet approved or cleared by the [place] FDA and  has been authorized for detection and/or diagnosis of SARS-CoV-2 by FDA under an Emergency Use Authorization (EUA). This EUA will remain  in effect (meaning this test can be used) for the duration of the COVID-19 declaration under Section 564(b)(1) of the Act, 21 U.S.C.section 360bbb-3(b)(1), unless the authorization is terminated  or revoked sooner.       Influenza A by PCR NEGATIVE NEGATIVE Final   Influenza B by PCR NEGATIVE NEGATIVE Final    Comment: (NOTE) The Xpert Xpress SARS-CoV-2/FLU/RSV plus assay is intended as an aid in the diagnosis of influenza from Nasopharyngeal swab specimens and should not be used as a sole basis for treatment. Nasal washings and aspirates are unacceptable for Xpert Xpress SARS-CoV-2/FLU/RSV testing.  Fact Sheet for Patients: BloggerCourse.com  Fact Sheet for Healthcare Providers: SeriousBroker.it  This test is not yet approved or  cleared by the [place] FDA and has been authorized for detection and/or diagnosis of SARS-CoV-2 by FDA under an Emergency Use Authorization (EUA). This EUA will remain in effect (meaning this test can be used) for the duration of the COVID-19 declaration under Section 564(b)(1) of the Act, 21 U.S.C. section 360bbb-3(b)(1), unless the authorization is terminated or revoked.     Resp Syncytial Virus by PCR POSITIVE (A) NEGATIVE Final    Comment: (NOTE) Fact Sheet for Patients: BloggerCourse.com  Fact Sheet for Healthcare Providers: SeriousBroker.it  This test is not yet approved or cleared by the [place] FDA and has been authorized for detection and/or diagnosis of SARS-CoV-2 by FDA under an Emergency Use Authorization (EUA). This EUA will remain in effect (meaning this test can be used) for the duration of the COVID-19 declaration under Section 564(b)(1) of the Act, 21 U.S.C. section 360bbb-3(b)(1), unless the authorization is terminated or revoked.  Performed at [facility], [address], [place], [place] [address]   Expectorated Sputum Assessment w Gram Stain, Rflx to Resp Cult     Status: None   Collection Time: 04/06/23  7:15 PM   Specimen: Expectorated Sputum  Result Value Ref Range Status   Specimen Description EXPECTORATED SPUTUM  Final   Special Requests NONE  Final   Sputum evaluation   Final    THIS SPECIMEN IS ACCEPTABLE FOR SPUTUM CULTURE Performed at [facility], [address] [place], [place] [address]    Report Status 04/06/2023 FINAL  Final  Culture, Respiratory w Gram Stain     Status: None (Preliminary result)   Collection Time: 04/06/23  7:15 PM  Result Value Ref Range Status   Specimen Description   Final    EXPECTORATED SPUTUM Performed at [facility], [address] [place], [place] [address]    Special Requests   Final     NONE Reflexed from [id number] Performed at [facility], [address] [place], [place] [address]    Gram Stain   Final    RARE WBC PRESENT, PREDOMINANTLY PMN RARE GRAM POSITIVE COCCI    Culture   Final    CULTURE REINCUBATED FOR BETTER GROWTH Performed at [facility], [address], [place], [place] [address]    Report Status PENDING  Incomplete    Radiology Studies: DG CHEST PORT 1 VIEW Result Date: 04/08/2023 CLINICAL DATA:  Shortness of breath. EXAM: PORTABLE CHEST 1 VIEW COMPARISON:  Radiographs 04/05/2023 and 05/18/2006. FINDINGS: 1136 hours. The heart size and mediastinal contours are stable. Worsening right greater than left airspace opacities with probable enlarging bilateral pleural effusions. No evidence of edema or pneumothorax. The bones appear unchanged. There is a mild convex right thoracic scoliosis status post lumbar fusion. Telemetry leads overlie the chest. IMPRESSION: Worsening right greater than left airspace opacities with probable enlarging bilateral pleural effusions. Findings are suspicious for progressive pneumonia with parapneumonic effusions. Continued follow-up recommended. Electronically Signed   By: Carey Bullocks M.D.   On: 04/08/2023 16:34   Scheduled Meds:  azithromycin  500 mg Oral Daily   enoxaparin (LOVENOX) injection  40 mg Subcutaneous Q24H   guaiFENesin  1,200 mg Oral BID   ipratropium  0.5 mg Nebulization Q6H   ketorolac  15 mg Intravenous Once   levalbuterol  0.63 mg Nebulization Q6H   Continuous Infusions:  cefTRIAXone (ROCEPHIN)  IV 1 g (04/08/23 1253)    LOS: 3 days   Marguerita Merles, DO [facility] Available via Epic secure chat 7am-7pm After these hours, please refer to coverage provider listed on [email or web address] 04/08/2023, 5:00 PM

## 2023-04-08 NOTE — Plan of Care (Signed)
  Problem: Education: Goal: Knowledge of General Education information will improve Description Including pain rating scale, medication(s)/side effects and non-pharmacologic comfort measures Outcome: Progressing   Problem: Health Behavior/Discharge Planning: Goal: Ability to manage health-related needs will improve Outcome: Progressing   

## 2023-04-08 NOTE — Plan of Care (Signed)
  Problem: Education: Goal: Knowledge of General Education information will improve Description: Including pain rating scale, medication(s)/side effects and non-pharmacologic comfort measures 04/08/2023 0910 by Genevie Ann, RN Outcome: Progressing 04/08/2023 0908 by Genevie Ann, RN Outcome: Progressing   Problem: Health Behavior/Discharge Planning: Goal: Ability to manage health-related needs will improve 04/08/2023 0910 by Genevie Ann, RN Outcome: Progressing 04/08/2023 0908 by Genevie Ann, RN Outcome: Progressing

## 2023-04-08 NOTE — Hospital Course (Addendum)
The patient is a 62 year old F with PMH of diffuse osteoarthritis/degenerative disc disease, prior lumbar fusion and thoracic outlet syndrome presenting with dyspnea, cough, wheezing, fatigue, myalgia, headache, decreased appetite and diarrhea, and admitted with working diagnosis of acute respiratory failure with hypoxia due to RSV infection and bacterial pneumonia.  She was slightly tachycardic and tachypneic with mild hypotension.  Desaturated to 89% on RA requiring 2 L to recover.  She had mild leukopenia.  CXR showed bilateral lower lobe consolidative opacities, right > left.  Pro-Cal elevated to 4.4.  Started on IV fluid, ceftriaxone and Zithromax and admitted.   Strep pneumo urinary antigen positive.  Remains on ceftriaxone and Zithromax.  Improving.  **Interim History  Slowly improving but desaturated on Ambulatory Home O2 Screen today and now CXR showed worsening. Will add Xopenex/Atrovent as well as Brovana/Budesonide as well as Flutter and Guaifenesin 1200 mg po BID.   Assessment and Plan:  Severe Sepsis with Acute Respiratory Failure with hypoxia due to RSV infection and Superimposed bacterial Strep Pneumo Pneumonia:  -POA.  Was tachycardic with tachypnea, leukopenia and respiratory failure with hypoxemia to 89% on arrival.   -CXR with bibasilar infiltrate/opacity.  RSV PCR positive.  -But also Gram positive cocci in gram stain of sputum.  Pro-Cal elevated to 4.45 suggesting bacterial infection.  Strep pneumo antigen positive.   -Improving but still with frequent cough. Looking non toxic again today.  SpO2: 96 % O2 Flow Rate (L/min): 1.5 L/min -On room air at this time (at least on rest) but desaturated on Ambulation -Continue IV Ceftriaxone and Azithromycin for now -S/p IV fluids. Hold same. -Continue bronchodilators and change to Xopenex/Atrovent and add Brovana and Budesonide, mucolytic's and antitussive -Continuous pulse oximetry maintain O2 saturation greater than 90% -Continue  supplemental oxygen via nasal cannula and wean O2 as tolerated -Incentive spirometry/OOB/PT/OT -Add guaifenesin 1200 P.o. twice daily and flutter valve as well as Xopenex and Atrovent; Will also add Budesonide and Brovana -Follow sputum culture for ID and sensitivities and showed Normal Respiratory Flora. Thus far reasonable response to not be worried about the MRSA -Repeat CXR yesterday AM done and showed "Worsening right greater than left airspace opacities with probable enlarging bilateral pleural effusions. Findings are suspicious for progressive pneumonia with parapneumonic effusions. Continued follow-up recommended." -Repeat CXR this AM done and showed "Stable cardiomediastinal silhouette. Mild S-shaped scoliosis of thoracic spine is noted. Grossly stable bibasilar opacities are noted concerning for pneumonia with associated effusions." -Continue droplet and contact precaution for RSV -Repeat chest x-ray in the a.m.   Otitis Media  -She reports she has been having ear pain and right sided headache for the last 3 days on tympanic membrane evaluation this morning, does appear inflamed, although not bulging.   -It is possible patient and mom otitis media at presentation due to Streptococcus.   -This seems to be resolving at this time continue with clinical monitoring.   -C/w IV Ceftriaxone as above.   -Continue with acetaminophen for pain for which patient is having good response. -Will need outpatient evaluation by ENT if necessary  Headache -Not amenable to Acetaminophen so we will try Ketorolac   Hyponatremia -Na+ Trend: Recent Labs  Lab 04/05/23 0957 04/06/23 0537 04/07/23 0532 04/08/23 0616 04/09/23 0522  NA 128* 133* 129* 134* 131*  -She will ascribed to be dehydration from acute illness and received IVF -Patient is generally feeling better no nausea vomiting or diarrhea.  -Osmolality is 282 and TSH was 3.185 with a free T4 of 0.95 and  urine sodium 49 -Continue to monitor  and trend and repeat CMP in a.m as Na+ dropped again   Abnormal LFTs -Mild and likely reactive -AST and ALT Trend: Recent Labs  Lab 04/06/23 0537 04/08/23 0605 04/09/23 0522  AST 27 61* 73*  ALT 14 37 58*  -Continue to monitor and trend and if necessary will do further workup with a right upper quadrant ultrasound and acute hepatitis panel  Hypokalemia Hypophosphatemia Hypocalcemia:  -Electrolyte Trend: Recent Labs  Lab 04/05/23 0957 04/06/23 0537 04/07/23 0532 04/08/23 0616 04/09/23 0522  K 3.0* 3.3* 3.8 3.8 3.5  PHOS 2.1*  --  2.8 4.1 3.5  CALCIUM 8.6* 8.0* 7.9* 8.2* 8.0*  -Continue to Monitor and Trend and Replete as Necessary -Repeat CMP and Phos in the AM  Normocytic Anemia -Hgb/Hct Trend: Recent Labs  Lab 04/05/23 0957 04/06/23 0537 04/07/23 0532 04/08/23 0605 04/09/23 0522  HGB 12.4 10.8* 10.0* 10.4* 10.2*  HCT 36.8 33.2* 29.6* 31.9* 30.8*  MCV 92.9 96.8 93.4 95.8 94.8  -Checked Anemia Panel and showed an iron level of 36, UIBC 125, TIBC 161, saturation ratio 22%, ferritin level 187, folate level 14.6 and vitamin B12 level of 1460 -Continue to Monitor for S/Sx of Bleeding; No overt bleeding noted -Repeat CBC in the AM   Hypoalbuminemia -Patient's Albumin Trend: Recent Labs  Lab 04/06/23 0537 04/07/23 0532 04/08/23 0605 04/09/23 0522  ALBUMIN 2.7* 2.4* 2.3* 2.5*  -Continue to Monitor and Trend and repeat CMP in the AM

## 2023-04-08 NOTE — Progress Notes (Signed)
Mobility Specialist - Progress Note   04/08/23 1423  Mobility  Activity Ambulated independently in hallway  Level of Assistance Independent  Assistive Device None  Distance Ambulated (ft) 480 ft  Activity Response Tolerated well  Mobility Referral Yes  Mobility visit 1 Mobility  Mobility Specialist Start Time (ACUTE ONLY) 1411  Mobility Specialist Stop Time (ACUTE ONLY) 1422  Mobility Specialist Time Calculation (min) (ACUTE ONLY) 11 min   Pt received in bed and agreeable to mobility. Pt desat to 86% but was able to bring it up to 90% in a matter of seconds. No complaints during session. Pt to bed after session with all needs met.    Pre-mobility: 94 HR, 93%  SpO2 During mobility: 105 HR, 86-90% SpO2 Post-mobility: 98 HR, 92% SPO2  Chief Technology Officer

## 2023-04-09 ENCOUNTER — Inpatient Hospital Stay (HOSPITAL_COMMUNITY): Payer: Medicare Other

## 2023-04-09 DIAGNOSIS — J9601 Acute respiratory failure with hypoxia: Secondary | ICD-10-CM | POA: Diagnosis not present

## 2023-04-09 DIAGNOSIS — G629 Polyneuropathy, unspecified: Secondary | ICD-10-CM

## 2023-04-09 DIAGNOSIS — E876 Hypokalemia: Secondary | ICD-10-CM | POA: Diagnosis not present

## 2023-04-09 DIAGNOSIS — E871 Hypo-osmolality and hyponatremia: Secondary | ICD-10-CM | POA: Diagnosis not present

## 2023-04-09 DIAGNOSIS — A491 Streptococcal infection, unspecified site: Secondary | ICD-10-CM

## 2023-04-09 LAB — CULTURE, RESPIRATORY W GRAM STAIN: Culture: NORMAL

## 2023-04-09 LAB — COMPREHENSIVE METABOLIC PANEL
ALT: 58 U/L — ABNORMAL HIGH (ref 0–44)
AST: 73 U/L — ABNORMAL HIGH (ref 15–41)
Albumin: 2.5 g/dL — ABNORMAL LOW (ref 3.5–5.0)
Alkaline Phosphatase: 94 U/L (ref 38–126)
Anion gap: 9 (ref 5–15)
BUN: 9 mg/dL (ref 8–23)
CO2: 24 mmol/L (ref 22–32)
Calcium: 8 mg/dL — ABNORMAL LOW (ref 8.9–10.3)
Chloride: 98 mmol/L (ref 98–111)
Creatinine, Ser: 0.48 mg/dL (ref 0.44–1.00)
GFR, Estimated: >60 mL/min (ref >60)
Glucose, Bld: 86 mg/dL (ref 70–99)
Potassium: 3.5 mmol/L (ref 3.5–5.1)
Sodium: 131 mmol/L — ABNORMAL LOW (ref 135–145)
Total Bilirubin: 0.5 mg/dL (ref <1.2)
Total Protein: 6 g/dL — ABNORMAL LOW (ref 6.5–8.1)

## 2023-04-09 LAB — CBC WITH DIFFERENTIAL/PLATELET
Abs Immature Granulocytes: 0.53 10*3/uL — ABNORMAL HIGH (ref 0.00–0.07)
Basophils Absolute: 0.1 10*3/uL (ref 0.0–0.1)
Basophils Relative: 1 %
Eosinophils Absolute: 0.1 10*3/uL (ref 0.0–0.5)
Eosinophils Relative: 1 %
HCT: 30.8 % — ABNORMAL LOW (ref 36.0–46.0)
Hemoglobin: 10.2 g/dL — ABNORMAL LOW (ref 12.0–15.0)
Immature Granulocytes: 7 %
Lymphocytes Relative: 18 %
Lymphs Abs: 1.5 10*3/uL (ref 0.7–4.0)
MCH: 31.4 pg (ref 26.0–34.0)
MCHC: 33.1 g/dL (ref 30.0–36.0)
MCV: 94.8 fL (ref 80.0–100.0)
Monocytes Absolute: 0.6 10*3/uL (ref 0.1–1.0)
Monocytes Relative: 7 %
Neutro Abs: 5.3 10*3/uL (ref 1.7–7.7)
Neutrophils Relative %: 66 %
Platelets: 372 10*3/uL (ref 150–400)
RBC: 3.25 MIL/uL — ABNORMAL LOW (ref 3.87–5.11)
RDW: 12.9 % (ref 11.5–15.5)
WBC: 8 10*3/uL (ref 4.0–10.5)
nRBC: 0 % (ref 0.0–0.2)

## 2023-04-09 LAB — IRON AND TIBC
Iron: 36 ug/dL (ref 28–170)
Saturation Ratios: 22 % (ref 10.4–31.8)
TIBC: 161 ug/dL — ABNORMAL LOW (ref 250–450)
UIBC: 125 ug/dL

## 2023-04-09 LAB — PHOSPHORUS: Phosphorus: 3.5 mg/dL (ref 2.5–4.6)

## 2023-04-09 LAB — MAGNESIUM: Magnesium: 2.1 mg/dL (ref 1.7–2.4)

## 2023-04-09 LAB — FERRITIN: Ferritin: 187 ng/mL (ref 11–307)

## 2023-04-09 LAB — VITAMIN B12: Vitamin B-12: 1460 pg/mL — ABNORMAL HIGH (ref 180–914)

## 2023-04-09 LAB — RETICULOCYTES
Immature Retic Fract: 26.1 % — ABNORMAL HIGH (ref 2.3–15.9)
RBC.: 3.22 MIL/uL — ABNORMAL LOW (ref 3.87–5.11)
Retic Count, Absolute: 24.2 10*3/uL (ref 19.0–186.0)
Retic Ct Pct: 0.8 % (ref 0.4–3.1)

## 2023-04-09 LAB — FOLATE: Folate: 14.6 ng/mL (ref >5.9)

## 2023-04-09 MED ORDER — KETOROLAC TROMETHAMINE 15 MG/ML IJ SOLN
15.0000 mg | Freq: Once | INTRAMUSCULAR | Status: AC
  Administered 2023-04-09: 15 mg via INTRAVENOUS
  Filled 2023-04-09: qty 1

## 2023-04-09 MED ORDER — BUDESONIDE 0.25 MG/2ML IN SUSP
0.2500 mg | Freq: Two times a day (BID) | RESPIRATORY_TRACT | Status: DC
Start: 2023-04-09 — End: 2023-04-10
  Administered 2023-04-09 – 2023-04-10 (×3): 0.25 mg via RESPIRATORY_TRACT
  Filled 2023-04-09 (×3): qty 2

## 2023-04-09 MED ORDER — IPRATROPIUM-ALBUTEROL 0.5-2.5 (3) MG/3ML IN SOLN
3.0000 mL | Freq: Three times a day (TID) | RESPIRATORY_TRACT | Status: DC
  Administered 2023-04-10 (×2): 3 mL via RESPIRATORY_TRACT
  Filled 2023-04-09 (×2): qty 3

## 2023-04-09 MED ORDER — ARFORMOTEROL TARTRATE 15 MCG/2ML IN NEBU
15.0000 ug | INHALATION_SOLUTION | Freq: Two times a day (BID) | RESPIRATORY_TRACT | Status: DC
  Administered 2023-04-09 – 2023-04-10 (×3): 15 ug via RESPIRATORY_TRACT
  Filled 2023-04-09 (×3): qty 2

## 2023-04-09 NOTE — Plan of Care (Signed)
  Problem: Activity: Goal: Risk for activity intolerance will decrease Outcome: Progressing   Problem: Nutrition: Goal: Adequate nutrition will be maintained Outcome: Progressing   Problem: Coping: Goal: Level of anxiety will decrease Outcome: Progressing   

## 2023-04-09 NOTE — Plan of Care (Signed)
  Problem: Education: Goal: Knowledge of General Education information will improve Description: Including pain rating scale, medication(s)/side effects and non-pharmacologic comfort measures Outcome: Progressing   Problem: Clinical Measurements: Goal: Ability to maintain clinical measurements within normal limits will improve Outcome: Progressing Goal: Respiratory complications will improve Outcome: Progressing   Problem: Nutrition: Goal: Adequate nutrition will be maintained Outcome: Progressing   Problem: Pain Management: Goal: General experience of comfort will improve Outcome: Progressing   Problem: Safety: Goal: Ability to remain free from injury will improve Outcome: Progressing   Problem: Activity: Goal: Ability to tolerate increased activity will improve Outcome: Progressing   Problem: Clinical Measurements: Goal: Ability to maintain a body temperature in the normal range will improve Outcome: Progressing   Problem: Respiratory: Goal: Ability to maintain a clear airway will improve Outcome: Progressing

## 2023-04-09 NOTE — Progress Notes (Signed)
Patient Oxygen saturation, at rest on room air is 97%.  Patient Oxygen saturation, while ambulating in the hallway, is 95% on room air then dropped down to 82%.   Applied 2 liters nasal cannula, went up to 96%.

## 2023-04-09 NOTE — Progress Notes (Signed)
PROGRESS NOTE    Allison Stevenson  EXB:[id number] DOB: 04-May-1960 DOA: 04/05/2023 PCP: Milus Height, PA   Brief Narrative:  The patient is a 62 year old F with PMH of diffuse osteoarthritis/degenerative disc disease, prior lumbar fusion and thoracic outlet syndrome presenting with dyspnea, cough, wheezing, fatigue, myalgia, headache, decreased appetite and diarrhea, and admitted with working diagnosis of acute respiratory failure with hypoxia due to RSV infection and bacterial pneumonia.  She was slightly tachycardic and tachypneic with mild hypotension.  Desaturated to 89% on RA requiring 2 L to recover.  She had mild leukopenia.  CXR showed bilateral lower lobe consolidative opacities, right > left.  Pro-Cal elevated to 4.4.  Started on IV fluid, ceftriaxone and Zithromax and admitted.   Strep pneumo urinary antigen positive.  Remains on ceftriaxone and Zithromax.  Improving.  **Interim History  Slowly improving but desaturated on Ambulatory Home O2 Screen today and now CXR showed worsening. Will add Xopenex/Atrovent as well as Brovana/Budesonide as well as Flutter and Guaifenesin 1200 mg po BID.   Assessment and Plan:  Severe Sepsis with Acute Respiratory Failure with hypoxia due to RSV infection and Superimposed bacterial Strep Pneumo Pneumonia:  -POA.  Was tachycardic with tachypnea, leukopenia and respiratory failure with hypoxemia to 89% on arrival.   -CXR with bibasilar infiltrate/opacity.  RSV PCR positive.  -But also Gram positive cocci in gram stain of sputum.  Pro-Cal elevated to 4.45 suggesting bacterial infection.  Strep pneumo antigen positive.   -Improving but still with frequent cough. Looking non toxic again today.  SpO2: 96 % O2 Flow Rate (L/min): 1.5 L/min -On room air at this time (at least on rest) but desaturated on Ambulation -Continue IV Ceftriaxone and Azithromycin for now -S/p IV fluids. Hold same. -Continue bronchodilators and change to Xopenex/Atrovent and  add Brovana and Budesonide, mucolytic's and antitussive -Continuous pulse oximetry maintain O2 saturation greater than 90% -Continue supplemental oxygen via nasal cannula and wean O2 as tolerated -Incentive spirometry/OOB/PT/OT -Add guaifenesin 1200 P.o. twice daily and flutter valve as well as Xopenex and Atrovent; Will also add Budesonide and Brovana -Follow sputum culture for ID and sensitivities and showed Normal Respiratory Flora. Thus far reasonable response to not be worried about the MRSA -Repeat CXR yesterday AM done and showed "Worsening right greater than left airspace opacities with probable enlarging bilateral pleural effusions. Findings are suspicious for progressive pneumonia with parapneumonic effusions. Continued follow-up recommended." -Repeat CXR this AM done and showed "Stable cardiomediastinal silhouette. Mild S-shaped scoliosis of thoracic spine is noted. Grossly stable bibasilar opacities are noted concerning for pneumonia with associated effusions." -Continue droplet and contact precaution for RSV -Repeat chest x-ray in the a.m.   Otitis Media  -She reports she has been having ear pain and right sided headache for the last 3 days on tympanic membrane evaluation this morning, does appear inflamed, although not bulging.   -It is possible patient and mom otitis media at presentation due to Streptococcus.   -This seems to be resolving at this time continue with clinical monitoring.   -C/w IV Ceftriaxone as above.   -Continue with acetaminophen for pain for which patient is having good response. -Will need outpatient evaluation by ENT if necessary  Headache -Not amenable to Acetaminophen so we will try Ketorolac   Hyponatremia -Na+ Trend: Recent Labs  Lab 04/05/23 0957 04/06/23 0537 04/07/23 0532 04/08/23 0616 04/09/23 0522  NA 128* 133* 129* 134* 131*  -She will ascribed to be dehydration from acute illness and received IVF -Patient is  generally feeling better no  nausea vomiting or diarrhea.  -Osmolality is 282 and TSH was 3.185 with a free T4 of 0.95 and urine sodium 49 -Continue to monitor and trend and repeat CMP in a.m as Na+ dropped again   Abnormal LFTs -Mild and likely reactive -AST and ALT Trend: Recent Labs  Lab 04/06/23 0537 04/08/23 0605 04/09/23 0522  AST 27 61* 73*  ALT 14 37 58*  -Continue to monitor and trend and if necessary will do further workup with a right upper quadrant ultrasound and acute hepatitis panel  Hypokalemia Hypophosphatemia Hypocalcemia:  -Electrolyte Trend: Recent Labs  Lab 04/05/23 0957 04/06/23 0537 04/07/23 0532 04/08/23 0616 04/09/23 0522  K 3.0* 3.3* 3.8 3.8 3.5  PHOS 2.1*  --  2.8 4.1 3.5  CALCIUM 8.6* 8.0* 7.9* 8.2* 8.0*  -Continue to Monitor and Trend and Replete as Necessary -Repeat CMP and Phos in the AM  Normocytic Anemia -Hgb/Hct Trend: Recent Labs  Lab 04/05/23 0957 04/06/23 0537 04/07/23 0532 04/08/23 0605 04/09/23 0522  HGB 12.4 10.8* 10.0* 10.4* 10.2*  HCT 36.8 33.2* 29.6* 31.9* 30.8*  MCV 92.9 96.8 93.4 95.8 94.8  -Checked Anemia Panel and showed an iron level of 36, UIBC 125, TIBC 161, saturation ratio 22%, ferritin level 187, folate level 14.6 and vitamin B12 level of 1460 -Continue to Monitor for S/Sx of Bleeding; No overt bleeding noted -Repeat CBC in the AM   Hypoalbuminemia -Patient's Albumin Trend: Recent Labs  Lab 04/06/23 0537 04/07/23 0532 04/08/23 0605 04/09/23 0522  ALBUMIN 2.7* 2.4* 2.3* 2.5*  -Continue to Monitor and Trend and repeat CMP in the AM   DVT prophylaxis: enoxaparin (LOVENOX) injection 40 mg Start: 04/05/23 2200    Code Status: Full Code Family Communication: No family present at bedside   Disposition Plan:  Level of care: Telemetry Status is: Inpatient Remains inpatient appropriate because: Needs further clinical improvement given that she continues to [employer]   Consultants:  None  Procedures:  As delineated as  above  Antimicrobials:  Anti-infectives (From admission, onward)    Start     Dose/Rate Route Frequency Ordered Stop   04/08/23 1000  azithromycin (ZITHROMAX) tablet 500 mg        500 mg Oral Daily 04/07/23 1412 04/09/23 1054   04/05/23 1130  azithromycin (ZITHROMAX) 500 mg in sodium chloride 0.9 % 250 mL IVPB  Status:  Discontinued        500 mg 250 mL/hr over 60 Minutes Intravenous Every 24 hours 04/05/23 1126 04/07/23 1412   04/05/23 1130  cefTRIAXone (ROCEPHIN) 1 g in sodium chloride 0.9 % 100 mL IVPB        1 g 200 mL/hr over 30 Minutes Intravenous Every 24 hours 04/05/23 1126         Subjective: Seen and examined at bedside and she was complaining of a headache today.  Desaturated again on ambulation.  Thinks that she is able to cough up a little bit more sputum today.  No nausea or vomiting.  No other concerns or complaints this time besides for pretty significant headache.  Objective: Vitals:   04/09/23 0645 04/09/23 1319 04/09/23 1930 04/09/23 1948  BP:    121/82  Pulse:    (!) 105  Resp:    16  Temp:    98.4 F (36.9 C)  TempSrc:    Oral  SpO2: 94% 95% 92% 96%  Weight:      Height:        Intake/Output Summary (Last  24 hours) at 04/09/2023 2045 Last data filed at 04/09/2023 0900 Gross per 24 hour  Intake 120 ml  Output --  Net 120 ml   Filed Weights   04/05/23 1857  Weight: 63.5 kg   Examination: Physical Exam:  Constitutional: Thin Caucasian female no acute distress Respiratory: Diminished to auscultation bilaterally with some coarse breath sounds and does have some crackles and some rhonchi but no appreciable rales or wheezing. Normal respiratory effort and patient is not tachypenic. No accessory muscle use.  Not wearing supplemental oxygen via nasal cannula Cardiovascular: RRR, no murmurs / rubs / gallops. S1 and S2 auscultated. No extremity edema Abdomen: Soft, non-tender, non-distended. Bowel sounds positive.  GU: Deferred. Musculoskeletal: No  clubbing / cyanosis of digits/nails. No joint deformity upper and lower extremities. Skin: No rashes, lesions, ulcers limited skin evaluation. No induration; Warm and dry.  Neurologic: CN 2-12 grossly intact with no focal deficits. Romberg sign and cerebellar reflexes not assessed.  Psychiatric: Normal judgment and insight. Alert and oriented x 3. Normal mood and appropriate affect.   Data Reviewed: I have personally reviewed following labs and imaging studies  CBC: Recent Labs  Lab 04/05/23 0957 04/06/23 0537 04/07/23 0532 04/08/23 0605 04/09/23 0522  WBC 3.0* 5.2 9.1 8.1 8.0  NEUTROABS  --   --   --  6.2 5.3  HGB 12.4 10.8* 10.0* 10.4* 10.2*  HCT 36.8 33.2* 29.6* 31.9* 30.8*  MCV 92.9 96.8 93.4 95.8 94.8  PLT 229 209 243 PLATELET CLUMPS NOTED ON SMEAR, UNABLE TO ESTIMATE 372   Basic Metabolic Panel: Recent Labs  Lab 04/05/23 0957 04/06/23 0537 04/07/23 0532 04/08/23 0605 04/08/23 0616 04/09/23 0522  NA 128* 133* 129*  --  134* 131*  K 3.0* 3.3* 3.8  --  3.8 3.5  CL 95* 102 100  --  100 98  CO2 22 23 22   --  22 24  GLUCOSE 97 90 86  --  80 86  BUN 14 14 15   --  11 9  CREATININE 0.76 0.63 0.60  --  0.45 0.48  CALCIUM 8.6* 8.0* 7.9*  --  8.2* 8.0*  MG 2.1  --  2.2 1.9  --  2.1  PHOS 2.1*  --  2.8  --  4.1 3.5   GFR: Estimated Creatinine Clearance: 72.2 mL/min (by C-G formula based on SCr of 0.48 mg/dL). Liver Function Tests: Recent Labs  Lab 04/06/23 0537 04/07/23 0532 04/08/23 0605 04/09/23 0522  AST 27  --  61* 73*  ALT 14  --  37 58*  ALKPHOS 63  --  87 94  BILITOT 0.8  --  0.7 0.5  PROT 5.9*  --  5.1* 6.0*  ALBUMIN 2.7* 2.4* 2.3* 2.5*   No results for input(s): "LIPASE", "AMYLASE" in the last 168 hours. No results for input(s): "AMMONIA" in the last 168 hours. Coagulation Profile: No results for input(s): "INR", "PROTIME" in the last 168 hours. Cardiac Enzymes: No results for input(s): "CKTOTAL", "CKMB", "CKMBINDEX", "TROPONINI" in the last 168  hours. BNP (last 3 results) No results for input(s): "PROBNP" in the last 8760 hours. HbA1C: No results for input(s): "HGBA1C" in the last 72 hours. CBG: Recent Labs  Lab 04/05/23 1034  GLUCAP 89   Lipid Profile: No results for input(s): "CHOL", "HDL", "LDLCALC", "TRIG", "CHOLHDL", "LDLDIRECT" in the last 72 hours. Thyroid Function Tests: Recent Labs    04/08/23 0616  TSH 3.185  FREET4 0.95   Anemia Panel: Recent Labs    04/09/23  0522  VITAMINB12 1,460*  FOLATE 14.6  FERRITIN 187  TIBC 161*  IRON 36  RETICCTPCT 0.8   Sepsis Labs: Recent Labs  Lab 04/05/23 0957  PROCALCITON 4.45   Recent Results (from the past 240 hours)  Resp panel by RT-PCR (RSV, Flu A&B, Covid) Anterior Nasal Swab     Status: Abnormal   Collection Time: 04/05/23  9:38 AM   Specimen: Anterior Nasal Swab  Result Value Ref Range Status   SARS Coronavirus 2 by RT PCR NEGATIVE NEGATIVE Final    Comment: (NOTE) SARS-CoV-2 target nucleic acids are NOT DETECTED.  The SARS-CoV-2 RNA is generally detectable in upper respiratory specimens during the acute phase of infection. The lowest concentration of SARS-CoV-2 viral copies this assay can detect is 138 copies/mL. A negative result does not preclude SARS-Cov-2 infection and should not be used as the sole basis for treatment or other patient management decisions. A negative result may occur with  improper specimen collection/handling, submission of specimen other than nasopharyngeal swab, presence of viral mutation(s) within the areas targeted by this assay, and inadequate number of viral copies(<138 copies/mL). A negative result must be combined with clinical observations, patient history, and epidemiological information. The expected result is Negative.  Fact Sheet for Patients:  BloggerCourse.com  Fact Sheet for Healthcare Providers:  SeriousBroker.it  This test is no t yet approved or cleared  by the [place] FDA and  has been authorized for detection and/or diagnosis of SARS-CoV-2 by FDA under an Emergency Use Authorization (EUA). This EUA will remain  in effect (meaning this test can be used) for the duration of the COVID-19 declaration under Section 564(b)(1) of the Act, 21 U.S.C.section 360bbb-3(b)(1), unless the authorization is terminated  or revoked sooner.       Influenza A by PCR NEGATIVE NEGATIVE Final   Influenza B by PCR NEGATIVE NEGATIVE Final    Comment: (NOTE) The Xpert Xpress SARS-CoV-2/FLU/RSV plus assay is intended as an aid in the diagnosis of influenza from Nasopharyngeal swab specimens and should not be used as a sole basis for treatment. Nasal washings and aspirates are unacceptable for Xpert Xpress SARS-CoV-2/FLU/RSV testing.  Fact Sheet for Patients: BloggerCourse.com  Fact Sheet for Healthcare Providers: SeriousBroker.it  This test is not yet approved or cleared by the [place] FDA and has been authorized for detection and/or diagnosis of SARS-CoV-2 by FDA under an Emergency Use Authorization (EUA). This EUA will remain in effect (meaning this test can be used) for the duration of the COVID-19 declaration under Section 564(b)(1) of the Act, 21 U.S.C. section 360bbb-3(b)(1), unless the authorization is terminated or revoked.     Resp Syncytial Virus by PCR POSITIVE (A) NEGATIVE Final    Comment: (NOTE) Fact Sheet for Patients: BloggerCourse.com  Fact Sheet for Healthcare Providers: SeriousBroker.it  This test is not yet approved or cleared by the [place] FDA and has been authorized for detection and/or diagnosis of SARS-CoV-2 by FDA under an Emergency Use Authorization (EUA). This EUA will remain in effect (meaning this test can be used) for the duration of the COVID-19 declaration under Section 564(b)(1) of the Act, 21  U.S.C. section 360bbb-3(b)(1), unless the authorization is terminated or revoked.  Performed at [facility], [address] [place], [place] [address]   Expectorated Sputum Assessment w Gram Stain, Rflx to Resp Cult     Status: None   Collection Time: 04/06/23  7:15 PM   Specimen: Expectorated Sputum  Result Value Ref Range Status  Specimen Description EXPECTORATED SPUTUM  Final   Special Requests NONE  Final   Sputum evaluation   Final    THIS SPECIMEN IS ACCEPTABLE FOR SPUTUM CULTURE Performed at [facility], [address], [place], [place] [address]    Report Status 04/06/2023 FINAL  Final  Culture, Respiratory w Gram Stain     Status: None   Collection Time: 04/06/23  7:15 PM  Result Value Ref Range Status   Specimen Description   Final    EXPECTORATED SPUTUM Performed at [facility], [address] [place], [place] [address]    Special Requests   Final    NONE Reflexed from [id number] Performed at [facility], [address] [place], [place] [address]    Gram Stain   Final    RARE WBC PRESENT, PREDOMINANTLY PMN RARE GRAM POSITIVE COCCI    Culture   Final    FEW Normal respiratory flora-no Staph aureus or Pseudomonas seen Performed at [facility], [address], [place], [place] [address]    Report Status 04/09/2023 FINAL  Final    Radiology Studies: DG CHEST PORT 1 VIEW Result Date: 04/09/2023 CLINICAL DATA:  Shortness of breath. EXAM: PORTABLE CHEST 1 VIEW COMPARISON:  April 08, 2023. FINDINGS: Stable cardiomediastinal silhouette. Mild S-shaped scoliosis of thoracic spine is noted. Grossly stable bibasilar opacities are noted concerning for pneumonia with associated effusions. IMPRESSION: Grossly stable bibasilar opacities as noted above. Electronically Signed   By: Lupita Raider M.D.   On: 04/09/2023 08:54   DG CHEST PORT 1 VIEW Result Date: 04/08/2023 CLINICAL DATA:   Shortness of breath. EXAM: PORTABLE CHEST 1 VIEW COMPARISON:  Radiographs 04/05/2023 and 05/18/2006. FINDINGS: 1136 hours. The heart size and mediastinal contours are stable. Worsening right greater than left airspace opacities with probable enlarging bilateral pleural effusions. No evidence of edema or pneumothorax. The bones appear unchanged. There is a mild convex right thoracic scoliosis status post lumbar fusion. Telemetry leads overlie the chest. IMPRESSION: Worsening right greater than left airspace opacities with probable enlarging bilateral pleural effusions. Findings are suspicious for progressive pneumonia with parapneumonic effusions. Continued follow-up recommended. Electronically Signed   By: Carey Bullocks M.D.   On: 04/08/2023 16:34   Scheduled Meds:  arformoterol  15 mcg Nebulization BID   budesonide (PULMICORT) nebulizer solution  0.25 mg Nebulization BID   enoxaparin (LOVENOX) injection  40 mg Subcutaneous Q24H   guaiFENesin  1,200 mg Oral BID   ipratropium-albuterol  3 mL Nebulization Q6H WA   Continuous Infusions:  cefTRIAXone (ROCEPHIN)  IV 1 g (04/09/23 1246)    LOS: 4 days   Marguerita Merles, DO [facility] Available via Epic secure chat 7am-7pm After these hours, please refer to coverage provider listed on [email or web address] 04/09/2023, 8:45 PM

## 2023-04-09 NOTE — Progress Notes (Signed)
Mobility Specialist - Progress Note   04/09/23 1031  Mobility  Activity Ambulated independently in hallway  Level of Assistance Independent  Assistive Device None  Distance Ambulated (ft) 500 ft  Activity Response Tolerated well  Mobility Referral Yes  Mobility visit 1 Mobility  Mobility Specialist Start Time (ACUTE ONLY) 1021  Mobility Specialist Stop Time (ACUTE ONLY) 1030  Mobility Specialist Time Calculation (min) (ACUTE ONLY) 9 min   Pt received in bed and agreeable to mobility. No complaints during session. Pt to bed after session with all needs met.    Pre-mobility: 89 HR, 91%  SpO2 (RA) Post-mobility: 85 HR, 91% SPO2 (RA)  Chief Technology Officer

## 2023-04-10 ENCOUNTER — Inpatient Hospital Stay (HOSPITAL_COMMUNITY): Payer: Medicare Other

## 2023-04-10 DIAGNOSIS — J9601 Acute respiratory failure with hypoxia: Secondary | ICD-10-CM | POA: Diagnosis not present

## 2023-04-10 DIAGNOSIS — E876 Hypokalemia: Secondary | ICD-10-CM | POA: Diagnosis not present

## 2023-04-10 DIAGNOSIS — E871 Hypo-osmolality and hyponatremia: Secondary | ICD-10-CM | POA: Diagnosis not present

## 2023-04-10 LAB — MAGNESIUM: Magnesium: 2.2 mg/dL (ref 1.7–2.4)

## 2023-04-10 LAB — CBC WITH DIFFERENTIAL/PLATELET
Abs Immature Granulocytes: 0.3 10*3/uL — ABNORMAL HIGH (ref 0.00–0.07)
Basophils Absolute: 0 10*3/uL (ref 0.0–0.1)
Basophils Relative: 0 %
Eosinophils Absolute: 0.2 10*3/uL (ref 0.0–0.5)
Eosinophils Relative: 2 %
HCT: 30.8 % — ABNORMAL LOW (ref 36.0–46.0)
Hemoglobin: 10 g/dL — ABNORMAL LOW (ref 12.0–15.0)
Lymphocytes Relative: 18 %
Lymphs Abs: 1.4 10*3/uL (ref 0.7–4.0)
MCH: 31.3 pg (ref 26.0–34.0)
MCHC: 32.5 g/dL (ref 30.0–36.0)
MCV: 96.6 fL (ref 80.0–100.0)
Metamyelocytes Relative: 1 %
Monocytes Absolute: 0.2 10*3/uL (ref 0.1–1.0)
Monocytes Relative: 2 %
Myelocytes: 3 %
Neutro Abs: 5.6 10*3/uL (ref 1.7–7.7)
Neutrophils Relative %: 74 %
Platelets: 418 10*3/uL — ABNORMAL HIGH (ref 150–400)
RBC: 3.19 MIL/uL — ABNORMAL LOW (ref 3.87–5.11)
RDW: 13 % (ref 11.5–15.5)
WBC: 7.5 10*3/uL (ref 4.0–10.5)
nRBC: 0 % (ref 0.0–0.2)

## 2023-04-10 LAB — COMPREHENSIVE METABOLIC PANEL
ALT: 62 U/L — ABNORMAL HIGH (ref 0–44)
AST: 59 U/L — ABNORMAL HIGH (ref 15–41)
Albumin: 2.7 g/dL — ABNORMAL LOW (ref 3.5–5.0)
Alkaline Phosphatase: 93 U/L (ref 38–126)
Anion gap: 9 (ref 5–15)
BUN: 8 mg/dL (ref 8–23)
CO2: 25 mmol/L (ref 22–32)
Calcium: 8.5 mg/dL — ABNORMAL LOW (ref 8.9–10.3)
Chloride: 104 mmol/L (ref 98–111)
Creatinine, Ser: 0.49 mg/dL (ref 0.44–1.00)
GFR, Estimated: >60 mL/min (ref >60)
Glucose, Bld: 88 mg/dL (ref 70–99)
Potassium: 4.1 mmol/L (ref 3.5–5.1)
Sodium: 138 mmol/L (ref 135–145)
Total Bilirubin: 0.4 mg/dL (ref <1.2)
Total Protein: 6.2 g/dL — ABNORMAL LOW (ref 6.5–8.1)

## 2023-04-10 LAB — PROCALCITONIN: Procalcitonin: 0.33 ng/mL

## 2023-04-10 LAB — PHOSPHORUS: Phosphorus: 3.7 mg/dL (ref 2.5–4.6)

## 2023-04-10 MED ORDER — GUAIFENESIN ER 600 MG PO TB12: 600.0000 mg | ORAL_TABLET | Freq: Two times a day (BID) | ORAL | 0 refills | Status: AC

## 2023-04-10 MED ORDER — ONDANSETRON HCL 4 MG PO TABS: 4.0000 mg | ORAL_TABLET | Freq: Four times a day (QID) | ORAL | 0 refills | Status: AC | PRN

## 2023-04-10 MED ORDER — ACETAMINOPHEN 325 MG PO TABS: 650.0000 mg | ORAL_TABLET | Freq: Four times a day (QID) | ORAL | 0 refills | Status: AC | PRN

## 2023-04-10 MED ORDER — CIPROFLOXACIN-DEXAMETHASONE 0.3-0.1 % OT SUSP: 4.0000 [drp] | Freq: Two times a day (BID) | OTIC | 0 refills | Status: AC

## 2023-04-10 MED ORDER — SALINE SPRAY 0.65 % NA SOLN: 1.0000 | NASAL | 0 refills | Status: AC | PRN

## 2023-04-10 MED ORDER — CEFDINIR 300 MG PO CAPS: 300.0000 mg | ORAL_CAPSULE | Freq: Two times a day (BID) | ORAL | 0 refills | Status: AC

## 2023-04-10 MED ORDER — ALBUTEROL SULFATE HFA 108 (90 BASE) MCG/ACT IN AERS: 2.0000 | INHALATION_SPRAY | Freq: Four times a day (QID) | RESPIRATORY_TRACT | 2 refills | Status: AC | PRN

## 2023-04-10 MED ORDER — CIPROFLOXACIN-DEXAMETHASONE 0.3-0.1 % OT SUSP
4.0000 [drp] | Freq: Two times a day (BID) | OTIC | Status: DC
  Filled 2023-04-10: qty 7.5

## 2023-04-10 NOTE — Progress Notes (Signed)
Discharge education performed with the patient. S/s requiring a call to her provider or visit to the ED were reviewed. All of her medications and when to take them were also reviewed in detail. She verbalized understanding of all the education. All of her questions and concerns were addressed to her satisfaction. Discharge pending her private transport to arrive. Will continue to monitor.

## 2023-04-10 NOTE — Discharge Summary (Signed)
Physician Discharge Summary   Patient: Allison Stevenson MRN: [id number] DOB: Dec 14, 1960  Admit date:     04/05/2023  Discharge date: 04/10/23  Discharge Physician: Marguerita Merles, DO   PCP: Milus Height, PA   Recommendations at discharge:   Follow-up with PCP within 1 to 2 weeks repeat CBC, CMP, mag, Phos within 1 week Follow-up with ENT in outpatient setting if necessary Repeat chest x-ray in 3 to 6 weeks and have PCP refer to pulmonary if necessary Follow-up with PCP and repeat LFTs to further evaluate liver function tests and if still elevated recommending obtaining a right upper quadrant ultrasound and a acute hepatitis panel  Discharge Diagnoses: Principal Problem:   Acute respiratory failure with hypoxia (HCC) Active Problems:   Neuropathy   Hyponatremia   Hypokalemia   Hypocalcemia   Neutropenia (HCC)   RSV (respiratory syncytial virus pneumonia)   Multifocal pneumonia   Severe sepsis (HCC)  Resolved Problems:   * No resolved hospital problems. [facility] Course: The patient is a 62 year old F with PMH of diffuse osteoarthritis/degenerative disc disease, prior lumbar fusion and thoracic outlet syndrome presenting with dyspnea, cough, wheezing, fatigue, myalgia, headache, decreased appetite and diarrhea, and admitted with working diagnosis of acute respiratory failure with hypoxia due to RSV infection and bacterial pneumonia.  She was slightly tachycardic and tachypneic with mild hypotension.  Desaturated to 89% on RA requiring 2 L to recover.  She had mild leukopenia.  CXR showed bilateral lower lobe consolidative opacities, right > left.  Pro-Cal elevated to 4.4.  Started on IV fluid, ceftriaxone and Zithromax and admitted.   Strep pneumo urinary antigen positive.  Remains on ceftriaxone and Zithromax.  Improving.  **Interim History  Slowly improving but desaturated on Ambulatory Home O2 Screen last few days and chest x-ray today showed unchanged multifocal pneumonia.   She was able to ambulate and did not desaturate and feels well.  She will continue flutter valve and incentive spirometer at home and was given bronchodilators while she was hospitalized.  She will need follow-up with PCP, ENT and repeat chest x-ray in 3 to 6 weeks and if necessary will need outpatient pulmonary follow-up.  She was transitioned to oral antibiotics and did well  Assessment and Plan:  Severe Sepsis with Acute Respiratory Failure with hypoxia due to RSV infection and Superimposed bacterial Strep Pneumo Pneumonia:  -POA.  Was tachycardic with tachypnea, leukopenia and respiratory failure with hypoxemia to 89% on arrival.   -CXR with bibasilar infiltrate/opacity.  RSV PCR positive.  -But also Gram positive cocci in gram stain of sputum.  Pro-Cal elevated to 4.45 suggesting bacterial infection.  Strep pneumo antigen positive.   -Improving but still with frequent cough. Looking non toxic again today.  SpO2: 94 % O2 Flow Rate (L/min): 1.5 L/min -On room air at this time (at least on rest) but desaturated on Ambulation -Continue IV Ceftriaxone and Azithromycin for now and changed to oral cefdinir at discharge given that azithromycin has been completed -S/p IV fluids. Hold same. -Continue bronchodilators and change to Xopenex/Atrovent and add Brovana and Budesonide, mucolytic's and antitussive -Continuous pulse oximetry maintain O2 saturation greater than 90% -Continue supplemental oxygen via nasal cannula and wean O2 as tolerated -Incentive spirometry/OOB/PT/OT -Add guaifenesin 1200 P.o. twice daily and flutter valve as well as Xopenex and Atrovent; Will also add Budesonide and Brovana -Follow sputum culture for ID and sensitivities and showed Normal Respiratory Flora. Thus far reasonable response to not be worried about the MRSA -Continue droplet  and contact precaution for RSV -Repeat chest x-ray this a.m. showed unchanged multi focal pneumonia  Otitis Media  -She reports she has  been having ear pain and right sided headache for the last 3 days on tympanic membrane evaluation this morning, does appear inflamed, although not bulging.   -It is possible patient and mom otitis media at presentation due to Streptococcus.   -This seems to be resolving at this time continue with clinical monitoring.   -C/w IV Ceftriaxone as above and changed to oral antibiotics at discharge and will be discharged on cefdinir.   -Continue with acetaminophen for pain for which patient is having good response. -Will need outpatient evaluation by ENT and will be sent home on Ciprodex  Headache -Not amenable to Acetaminophen so we will try Ketorolac and this helped   Hyponatremia -Na+ Trend: Recent Labs  Lab 04/05/23 0957 04/06/23 0537 04/07/23 0532 04/08/23 0616 04/09/23 0522 04/10/23 0550  NA 128* 133* 129* 134* 131* 138  -She will ascribed to be dehydration from acute illness and received IVF -Patient is generally feeling better no nausea vomiting or diarrhea.  -Osmolality is 282 and TSH was 3.185 with a free T4 of 0.95 and urine sodium 49 -Continue to monitor and trend and repeat CMP within 1 week   Abnormal LFTs, relatively stable -Mild and likely reactive and now improving slowly -AST and ALT Trend: Recent Labs  Lab 04/06/23 0537 04/08/23 0605 04/09/23 0522 04/10/23 0550  AST 27 61* 73* 59*  ALT 14 37 58* 62*  -Continue to monitor and trend and repeat within 1 week; if necessary will do further workup with a right upper quadrant ultrasound and acute hepatitis panel  Hypokalemia Hypophosphatemia Hypocalcemia:  -Electrolyte Trend: Recent Labs  Lab 04/05/23 0957 04/06/23 0537 04/07/23 0532 04/08/23 0616 04/09/23 0522 04/10/23 0550  K 3.0* 3.3* 3.8 3.8 3.5 4.1  PHOS 2.1*  --  2.8 4.1 3.5 3.7  CALCIUM 8.6* 8.0* 7.9* 8.2* 8.0* 8.5*  -Continue to Monitor and Trend and Replete as Necessary -Repeat CMP and Phos level within 1 week  Normocytic Anemia -Hgb/Hct  Trend: Recent Labs  Lab 04/05/23 0957 04/06/23 0537 04/07/23 0532 04/08/23 0605 04/09/23 0522 04/10/23 0550  HGB 12.4 10.8* 10.0* 10.4* 10.2* 10.0*  HCT 36.8 33.2* 29.6* 31.9* 30.8* 30.8*  MCV 92.9 96.8 93.4 95.8 94.8 96.6  -Checked Anemia Panel and showed an iron level of 36, UIBC 125, TIBC 161, saturation ratio 22%, ferritin level 187, folate level 14.6 and vitamin B12 level of 1460 -Continue to Monitor for S/Sx of Bleeding; No overt bleeding noted -Repeat CBC within 1 week  Hypoalbuminemia -Patient's Albumin Trend: Recent Labs  Lab 04/06/23 0537 04/07/23 0532 04/08/23 0605 04/09/23 0522 04/10/23 0550  ALBUMIN 2.7* 2.4* 2.3* 2.5* 2.7*  -Continue to Monitor and trend and repeat CMP in the AM  Consultants: None Procedures performed: As delineated as above Disposition: Home Diet recommendation:  Discharge Diet Orders (From admission, onward)     Start     Ordered   04/10/23 0000  Diet - low sodium heart healthy        04/10/23 1415           Cardiac diet DISCHARGE MEDICATION: Allergies as of 04/10/2023       Reactions   Bee Venom Anaphylaxis, Swelling   "Throat closed up as a child"   Pregabalin Other (See Comments)   Loopy, vision changes    Robaxin [methocarbamol] Other (See Comments)   tremors  Medication List     TAKE these medications    acetaminophen 325 MG tablet Commonly known as: TYLENOL Take 2 tablets (650 mg total) by mouth every 6 (six) hours as needed for mild pain (pain score 1-3) (or Fever >/= 101).   albuterol 108 (90 Base) MCG/ACT inhaler Commonly known as: VENTOLIN HFA Inhale 2 puffs into the lungs every 6 (six) hours as needed for wheezing or shortness of breath.   CALCIUM-VITAMIN D PO Take 2 tablets by mouth daily.   cefdinir 300 MG capsule Commonly known as: OMNICEF Take 1 capsule (300 mg total) by mouth 2 (two) times daily for 4 days.   ciprofloxacin-dexamethasone OTIC suspension Commonly known as:  CIPRODEX Place 4 drops into the right ear 2 (two) times daily.   docusate sodium 100 MG capsule Commonly known as: Colace Take 1 capsule (100 mg total) by mouth 2 (two) times daily. While taking narcotic pain medicine. What changed:  how much to take when to take this reasons to take this additional instructions   EPINEPHrine 0.3 mg/0.3 mL Soaj injection Commonly known as: EPI-PEN Inject 0.3 mg into the muscle as needed for anaphylaxis.   guaiFENesin 600 MG 12 hr tablet Commonly known as: MUCINEX Take 1 tablet (600 mg total) by mouth 2 (two) times daily for 5 days.   loratadine 10 MG tablet Commonly known as: CLARITIN Take 10 mg by mouth daily as needed for allergies.   multivitamin with minerals Tabs tablet Take 1 tablet by mouth daily.   ondansetron 4 MG tablet Commonly known as: ZOFRAN Take 1 tablet (4 mg total) by mouth every 6 (six) hours as needed for nausea.   sodium chloride 0.65 % Soln nasal spray Commonly known as: OCEAN Place 1 spray into both nostrils as needed for congestion (nose irritation).        Discharge Exam: Filed Weights   04/05/23 1857  Weight: 63.5 kg   Vitals:   04/10/23 0454 04/10/23 0805  BP: 128/74   Pulse: 71   Resp: 18   Temp: 98.4 F (36.9 C)   SpO2: 95% 94%   Examination: Physical Exam:  Constitutional: Thin Caucasian female in no acute distress Respiratory: Diminished to auscultation bilaterally with some coarse breath sounds and some rhonchi, no wheezing, rales, rhonchi or crackles. Normal respiratory effort and patient is not tachypenic. No accessory muscle use.  Wearing no supplemental oxygen via nasal cannula did not desaturate on ambulatory screening Cardiovascular: RRR, no murmurs / rubs / gallops. S1 and S2 auscultated. No extremity edema. Abdomen: Soft, non-tender, non-distended. Bowel sounds positive.  GU: Deferred. Musculoskeletal: No clubbing / cyanosis of digits/nails. No joint deformity upper and lower  extremities.  Skin: No rashes, lesions, ulcers on limited skin evaluation. No induration; Warm and dry.  Neurologic: CN 2-12 grossly intact with no focal deficits. Romberg sign and cerebellar reflexes not assessed.  Psychiatric: Normal judgment and insight. Alert and oriented x 3. Normal mood and appropriate affect.   Condition at discharge: stable  The results of significant diagnostics from this hospitalization (including imaging, microbiology, ancillary and laboratory) are listed below for reference.   Imaging Studies: DG Chest 2 View Result Date: 04/10/2023 CLINICAL DATA:  Shortness of breath and cough EXAM: CHEST - 2 VIEW COMPARISON:  04/09/2023 FINDINGS: Stable cardiomediastinal silhouette. Right mid and lower lung airspace opacities. Left lower lung airspace opacities. Small left pleural effusion. No pneumothorax. IMPRESSION: Unchanged multifocal pneumonia. Electronically Signed   By: Minerva Fester M.D.   On: 04/10/2023  13:46   DG CHEST PORT 1 VIEW Result Date: 04/09/2023 CLINICAL DATA:  Shortness of breath. EXAM: PORTABLE CHEST 1 VIEW COMPARISON:  April 08, 2023. FINDINGS: Stable cardiomediastinal silhouette. Mild S-shaped scoliosis of thoracic spine is noted. Grossly stable bibasilar opacities are noted concerning for pneumonia with associated effusions. IMPRESSION: Grossly stable bibasilar opacities as noted above. Electronically Signed   By: Lupita Raider M.D.   On: 04/09/2023 08:54   DG CHEST PORT 1 VIEW Result Date: 04/08/2023 CLINICAL DATA:  Shortness of breath. EXAM: PORTABLE CHEST 1 VIEW COMPARISON:  Radiographs 04/05/2023 and 05/18/2006. FINDINGS: 1136 hours. The heart size and mediastinal contours are stable. Worsening right greater than left airspace opacities with probable enlarging bilateral pleural effusions. No evidence of edema or pneumothorax. The bones appear unchanged. There is a mild convex right thoracic scoliosis status post lumbar fusion. Telemetry leads  overlie the chest. IMPRESSION: Worsening right greater than left airspace opacities with probable enlarging bilateral pleural effusions. Findings are suspicious for progressive pneumonia with parapneumonic effusions. Continued follow-up recommended. Electronically Signed   By: Carey Bullocks M.D.   On: 04/08/2023 16:34   DG Chest 2 View Result Date: 04/05/2023 CLINICAL DATA:  Shortness of breath EXAM: CHEST - 2 VIEW COMPARISON:  X-ray 05/18/2006 FINDINGS: Hyperinflation. Normal cardiopericardial silhouette. No pneumothorax, edema or effusion. There is consolidative opacity identified along the right lung base, lower lobe. Some mild changes on the left side as well in the left lower lobe. Possible pneumonia. Curvature of the spine with some degenerative changes. Overlapping cardiac leads. IMPRESSION: Bilateral lower lobe consolidative opacities, right-greater-than-left. Multifocal pneumonia possible. Recommend follow up to confirm clearance. Electronically Signed   By: Karen Kays M.D.   On: 04/05/2023 10:53   Microbiology: Results for orders placed or performed during the hospital encounter of 04/05/23  Resp panel by RT-PCR (RSV, Flu A&B, Covid) Anterior Nasal Swab     Status: Abnormal   Collection Time: 04/05/23  9:38 AM   Specimen: Anterior Nasal Swab  Result Value Ref Range Status   SARS Coronavirus 2 by RT PCR NEGATIVE NEGATIVE Final    Comment: (NOTE) SARS-CoV-2 target nucleic acids are NOT DETECTED.  The SARS-CoV-2 RNA is generally detectable in upper respiratory specimens during the acute phase of infection. The lowest concentration of SARS-CoV-2 viral copies this assay can detect is 138 copies/mL. A negative result does not preclude SARS-Cov-2 infection and should not be used as the sole basis for treatment or other patient management decisions. A negative result may occur with  improper specimen collection/handling, submission of specimen other than nasopharyngeal swab, presence of  viral mutation(s) within the areas targeted by this assay, and inadequate number of viral copies(<138 copies/mL). A negative result must be combined with clinical observations, patient history, and epidemiological information. The expected result is Negative.  Fact Sheet for Patients:  BloggerCourse.com  Fact Sheet for Healthcare Providers:  SeriousBroker.it  This test is no t yet approved or cleared by the [place] FDA and  has been authorized for detection and/or diagnosis of SARS-CoV-2 by FDA under an Emergency Use Authorization (EUA). This EUA will remain  in effect (meaning this test can be used) for the duration of the COVID-19 declaration under Section 564(b)(1) of the Act, 21 U.S.C.section 360bbb-3(b)(1), unless the authorization is terminated  or revoked sooner.       Influenza A by PCR NEGATIVE NEGATIVE Final   Influenza B by PCR NEGATIVE NEGATIVE Final    Comment: (NOTE) The Xpert Xpress SARS-CoV-2/FLU/RSV  plus assay is intended as an aid in the diagnosis of influenza from Nasopharyngeal swab specimens and should not be used as a sole basis for treatment. Nasal washings and aspirates are unacceptable for Xpert Xpress SARS-CoV-2/FLU/RSV testing.  Fact Sheet for Patients: BloggerCourse.com  Fact Sheet for Healthcare Providers: SeriousBroker.it  This test is not yet approved or cleared by the [place] FDA and has been authorized for detection and/or diagnosis of SARS-CoV-2 by FDA under an Emergency Use Authorization (EUA). This EUA will remain in effect (meaning this test can be used) for the duration of the COVID-19 declaration under Section 564(b)(1) of the Act, 21 U.S.C. section 360bbb-3(b)(1), unless the authorization is terminated or revoked.     Resp Syncytial Virus by PCR POSITIVE (A) NEGATIVE Final    Comment: (NOTE) Fact Sheet for  Patients: BloggerCourse.com  Fact Sheet for Healthcare Providers: SeriousBroker.it  This test is not yet approved or cleared by the [place] FDA and has been authorized for detection and/or diagnosis of SARS-CoV-2 by FDA under an Emergency Use Authorization (EUA). This EUA will remain in effect (meaning this test can be used) for the duration of the COVID-19 declaration under Section 564(b)(1) of the Act, 21 U.S.C. section 360bbb-3(b)(1), unless the authorization is terminated or revoked.  Performed at [facility], [address] [place], [place] [address]   Expectorated Sputum Assessment w Gram Stain, Rflx to Resp Cult     Status: None   Collection Time: 04/06/23  7:15 PM   Specimen: Expectorated Sputum  Result Value Ref Range Status   Specimen Description EXPECTORATED SPUTUM  Final   Special Requests NONE  Final   Sputum evaluation   Final    THIS SPECIMEN IS ACCEPTABLE FOR SPUTUM CULTURE Performed at [facility], [address] [place], [place] [address]    Report Status 04/06/2023 FINAL  Final  Culture, Respiratory w Gram Stain     Status: None   Collection Time: 04/06/23  7:15 PM  Result Value Ref Range Status   Specimen Description   Final    EXPECTORATED SPUTUM Performed at [facility], [address] [place], [place] [address]    Special Requests   Final    NONE Reflexed from [id number] Performed at [facility], [address], [place], [place] [address]    Gram Stain   Final    RARE WBC PRESENT, PREDOMINANTLY PMN RARE GRAM POSITIVE COCCI    Culture   Final    FEW Normal respiratory flora-no Staph aureus or Pseudomonas seen Performed at [facility], [address] [place], [place] [address]    Report Status 04/09/2023 FINAL  Final   Labs: CBC: Recent Labs  Lab 04/06/23 0537 04/07/23 0532 04/08/23 0605 04/09/23 0522  04/10/23 0550  WBC 5.2 9.1 8.1 8.0 7.5  NEUTROABS  --   --  6.2 5.3 5.6  HGB 10.8* 10.0* 10.4* 10.2* 10.0*  HCT 33.2* 29.6* 31.9* 30.8* 30.8*  MCV 96.8 93.4 95.8 94.8 96.6  PLT 209 243 PLATELET CLUMPS NOTED ON SMEAR, UNABLE TO ESTIMATE 372 418*   Basic Metabolic Panel: Recent Labs  Lab 04/05/23 0957 04/06/23 0537 04/07/23 0532 04/08/23 0605 04/08/23 0616 04/09/23 0522 04/10/23 0550  NA 128* 133* 129*  --  134* 131* 138  K 3.0* 3.3* 3.8  --  3.8 3.5 4.1  CL 95* 102 100  --  100 98 104  CO2 22 23 22   --  22  24 25  GLUCOSE 97 90 86  --  80 86 88  BUN 14 14 15   --  11 9 8   CREATININE 0.76 0.63 0.60  --  0.45 0.48 0.49  CALCIUM 8.6* 8.0* 7.9*  --  8.2* 8.0* 8.5*  MG 2.1  --  2.2 1.9  --  2.1 2.2  PHOS 2.1*  --  2.8  --  4.1 3.5 3.7   Liver Function Tests: Recent Labs  Lab 04/06/23 0537 04/07/23 0532 04/08/23 0605 04/09/23 0522 04/10/23 0550  AST 27  --  61* 73* 59*  ALT 14  --  37 58* 62*  ALKPHOS 63  --  87 94 93  BILITOT 0.8  --  0.7 0.5 0.4  PROT 5.9*  --  5.1* 6.0* 6.2*  ALBUMIN 2.7* 2.4* 2.3* 2.5* 2.7*   CBG: Recent Labs  Lab 04/05/23 1034  GLUCAP 89   Discharge time spent: greater than 30 minutes.  Signed: Marguerita Merles, DO [facility] 04/11/2023

## 2023-04-10 NOTE — Plan of Care (Signed)
  Problem: Education: Goal: Knowledge of General Education information will improve Description: Including pain rating scale, medication(s)/side effects and non-pharmacologic comfort measures Outcome: Progressing   Problem: Clinical Measurements: Goal: Ability to maintain clinical measurements within normal limits will improve Outcome: Progressing Goal: Will remain free from infection Outcome: Progressing Goal: Respiratory complications will improve Outcome: Progressing   Problem: Nutrition: Goal: Adequate nutrition will be maintained Outcome: Progressing   Problem: Pain Management: Goal: General experience of comfort will improve Outcome: Progressing   Problem: Safety: Goal: Ability to remain free from injury will improve Outcome: Progressing

## 2023-04-10 NOTE — Progress Notes (Signed)
Mobility Specialist - Progress Note   04/10/23 0900  Mobility  Activity Ambulated with assistance in hallway  Level of Assistance Independent after set-up  Assistive Device None  Distance Ambulated (ft) 1000 ft  Range of Motion/Exercises Active  Activity Response Tolerated well  Mobility Referral Yes  Mobility visit 1 Mobility  Mobility Specialist Start Time (ACUTE ONLY) [id number]  Mobility Specialist Stop Time (ACUTE ONLY) 0912  Mobility Specialist Time Calculation (min) (ACUTE ONLY) 9 min   Received in restroom and agreed to mobility. No issues throughout session. SpO2% at the end of session was 90%, still with some c/o SOB and a HR of 130. Left EOB with all needs met.  Marilynne Halsted Mobility Specialist

## 2023-05-12 ENCOUNTER — Other Ambulatory Visit: Payer: Self-pay | Admitting: Physician Assistant

## 2023-05-12 ENCOUNTER — Ambulatory Visit
Admission: RE | Admit: 2023-05-12 | Discharge: 2023-05-12 | Disposition: A | Discharge status: Home / Self Care | Payer: Medicare Other | Source: Ambulatory Visit | Attending: Physician Assistant | Admitting: Physician Assistant

## 2023-05-12 DIAGNOSIS — J13 Pneumonia due to Streptococcus pneumoniae: Secondary | ICD-10-CM

## 2023-07-28 ENCOUNTER — Other Ambulatory Visit: Payer: Self-pay | Admitting: Physician Assistant

## 2023-07-28 DIAGNOSIS — R921 Mammographic calcification found on diagnostic imaging of breast: Secondary | ICD-10-CM

## 2023-08-31 ENCOUNTER — Encounter

## 2023-09-01 ENCOUNTER — Ambulatory Visit
Admission: RE | Admit: 2023-09-01 | Discharge: 2023-09-01 | Disposition: A | Discharge status: Home / Self Care | Source: Ambulatory Visit | Attending: Physician Assistant | Admitting: Physician Assistant

## 2023-09-01 DIAGNOSIS — R921 Mammographic calcification found on diagnostic imaging of breast: Secondary | ICD-10-CM
